# Patient Record
Sex: Male | Born: 1945 | Race: White | Hispanic: No | Marital: Married | Smoking: Former smoker
Health system: Southern US, Community
[De-identification: ages and names within clinical notes are randomized; demographics above are authoritative.]

## PROBLEM LIST (undated history)

## (undated) DIAGNOSIS — E785 Hyperlipidemia, unspecified: Secondary | ICD-10-CM

## (undated) DIAGNOSIS — I10 Essential (primary) hypertension: Secondary | ICD-10-CM

## (undated) HISTORY — DX: Hyperlipidemia, unspecified: E78.5

## (undated) HISTORY — DX: Essential (primary) hypertension: I10

---

## 2003-03-09 ENCOUNTER — Emergency Department (HOSPITAL_COMMUNITY): Admission: EM | Admit: 2003-03-09 | Discharge: 2003-03-09 | Payer: Self-pay | Admitting: Emergency Medicine

## 2005-01-22 ENCOUNTER — Ambulatory Visit: Payer: Self-pay | Admitting: Internal Medicine

## 2005-07-23 ENCOUNTER — Ambulatory Visit: Payer: Self-pay | Admitting: Internal Medicine

## 2005-12-10 ENCOUNTER — Ambulatory Visit: Payer: Self-pay | Admitting: Internal Medicine

## 2006-02-04 ENCOUNTER — Emergency Department (HOSPITAL_COMMUNITY): Admission: EM | Admit: 2006-02-04 | Discharge: 2006-02-04 | Payer: Self-pay | Admitting: Emergency Medicine

## 2006-06-10 ENCOUNTER — Ambulatory Visit: Payer: Self-pay | Admitting: Internal Medicine

## 2006-10-21 ENCOUNTER — Ambulatory Visit: Payer: Self-pay | Admitting: Internal Medicine

## 2006-10-21 LAB — CONVERTED CEMR LAB
BUN: 12 mg/dL (ref 6–23)
Creatinine, Ser: 1.3 mg/dL (ref 0.4–1.5)
GFR calc non Af Amer: 60 mL/min
HDL: 33.9 mg/dL — ABNORMAL LOW (ref >39.0)
LDL DIRECT: 185 mg/dL
Potassium: 3.8 meq/L (ref 3.5–5.1)
VLDL: 30 mg/dL (ref 0–40)

## 2007-02-10 ENCOUNTER — Ambulatory Visit: Payer: Self-pay | Admitting: Internal Medicine

## 2007-02-10 ENCOUNTER — Encounter: Payer: Self-pay | Admitting: Internal Medicine

## 2007-02-10 DIAGNOSIS — I10 Essential (primary) hypertension: Secondary | ICD-10-CM | POA: Insufficient documentation

## 2007-02-10 DIAGNOSIS — E785 Hyperlipidemia, unspecified: Secondary | ICD-10-CM

## 2007-02-10 LAB — CONVERTED CEMR LAB
Basophils Absolute: 0 10*3/uL (ref 0.0–0.1)
Bilirubin Urine: NEGATIVE
Cholesterol: 163 mg/dL (ref 0–200)
Eosinophils Absolute: 0.1 10*3/uL (ref 0.0–0.6)
GFR calc non Af Amer: 73 mL/min
HCT: 43.8 % (ref 39.0–52.0)
Hemoglobin: 15.5 g/dL (ref 13.0–17.0)
Hgb A1c MFr Bld: 5.9 % (ref 4.6–6.0)
Ketones, ur: NEGATIVE mg/dL
Lymphocytes Relative: 18.2 % (ref 12.0–46.0)
MCHC: 35.4 g/dL (ref 30.0–36.0)
MCV: 85.3 fL (ref 78.0–100.0)
Monocytes Absolute: 0.5 10*3/uL (ref 0.2–0.7)
Neutro Abs: 3.7 10*3/uL (ref 1.4–7.7)
Neutrophils Relative %: 70 % (ref 43.0–77.0)
Nitrite: NEGATIVE
Potassium: 4.1 meq/L (ref 3.5–5.1)
RBC: 5.14 M/uL (ref 4.22–5.81)
Sodium: 140 meq/L (ref 135–145)
Specific Gravity, Urine: 1.013 (ref 1.005–1.03)
Total CHOL/HDL Ratio: 4.7
pH: 7.5 (ref 5.0–8.0)

## 2007-10-09 ENCOUNTER — Ambulatory Visit: Payer: Self-pay | Admitting: Internal Medicine

## 2007-10-11 LAB — CONVERTED CEMR LAB
BUN: 9 mg/dL (ref 6–23)
CO2: 27 meq/L (ref 19–32)
Cholesterol: 151 mg/dL (ref 0–200)
GFR calc Af Amer: 80 mL/min
HDL: 30.5 mg/dL — ABNORMAL LOW (ref >39.0)
Potassium: 4.1 meq/L (ref 3.5–5.1)
Sodium: 136 meq/L (ref 135–145)
Triglycerides: 225 mg/dL (ref 0–149)
VLDL: 45 mg/dL — ABNORMAL HIGH (ref 0–40)

## 2008-05-10 ENCOUNTER — Ambulatory Visit: Payer: Self-pay | Admitting: Internal Medicine

## 2008-08-30 ENCOUNTER — Encounter (INDEPENDENT_AMBULATORY_CARE_PROVIDER_SITE_OTHER): Payer: Self-pay | Admitting: *Deleted

## 2008-11-12 ENCOUNTER — Encounter (INDEPENDENT_AMBULATORY_CARE_PROVIDER_SITE_OTHER): Payer: Self-pay | Admitting: *Deleted

## 2008-12-06 ENCOUNTER — Ambulatory Visit: Payer: Self-pay | Admitting: Internal Medicine

## 2008-12-06 ENCOUNTER — Encounter (INDEPENDENT_AMBULATORY_CARE_PROVIDER_SITE_OTHER): Payer: Self-pay | Admitting: *Deleted

## 2008-12-07 LAB — CONVERTED CEMR LAB
AST: 32 units/L (ref 0–37)
BUN: 11 mg/dL (ref 6–23)
Basophils Absolute: 0 10*3/uL (ref 0.0–0.1)
Basophils Relative: 0.3 % (ref 0.0–3.0)
CO2: 29 meq/L (ref 19–32)
Calcium: 9 mg/dL (ref 8.4–10.5)
Chloride: 102 meq/L (ref 96–112)
Cholesterol: 130 mg/dL (ref 0–200)
Creatinine, Ser: 1.2 mg/dL (ref 0.4–1.5)
Eosinophils Relative: 1.5 % (ref 0.0–5.0)
Glucose, Bld: 98 mg/dL (ref 70–99)
Hemoglobin: 16 g/dL (ref 13.0–17.0)
LDL Cholesterol: 77 mg/dL (ref 0–99)
Lymphocytes Relative: 20.4 % (ref 12.0–46.0)
MCHC: 33.7 g/dL (ref 30.0–36.0)
Monocytes Relative: 10.8 % (ref 3.0–12.0)
Neutro Abs: 3 10*3/uL (ref 1.4–7.7)
Neutrophils Relative %: 67 % (ref 43.0–77.0)
RBC: 5.23 M/uL (ref 4.22–5.81)
TSH: 0.86 microintl units/mL (ref 0.35–5.50)
VLDL: 23 mg/dL (ref 0–40)
WBC: 4.5 10*3/uL (ref 4.5–10.5)

## 2008-12-24 ENCOUNTER — Ambulatory Visit: Payer: Self-pay | Admitting: Internal Medicine

## 2008-12-30 ENCOUNTER — Encounter (INDEPENDENT_AMBULATORY_CARE_PROVIDER_SITE_OTHER): Payer: Self-pay | Admitting: *Deleted

## 2008-12-30 LAB — CONVERTED CEMR LAB: Fecal Occult Bld: NEGATIVE

## 2009-01-22 ENCOUNTER — Encounter (INDEPENDENT_AMBULATORY_CARE_PROVIDER_SITE_OTHER): Payer: Self-pay | Admitting: *Deleted

## 2009-05-16 ENCOUNTER — Ambulatory Visit: Payer: Self-pay | Admitting: Internal Medicine

## 2009-09-02 ENCOUNTER — Encounter (INDEPENDENT_AMBULATORY_CARE_PROVIDER_SITE_OTHER): Payer: Self-pay | Admitting: *Deleted

## 2009-11-21 ENCOUNTER — Ambulatory Visit: Payer: Self-pay | Admitting: Internal Medicine

## 2009-11-26 LAB — CONVERTED CEMR LAB
ALT: 34 units/L (ref 0–53)
AST: 27 units/L (ref 0–37)
Basophils Absolute: 0 10*3/uL (ref 0.0–0.1)
Calcium: 8.9 mg/dL (ref 8.4–10.5)
Cholesterol: 134 mg/dL (ref 0–200)
Eosinophils Relative: 2.1 % (ref 0.0–5.0)
GFR calc non Af Amer: 65.19 mL/min (ref >60)
Glucose, Bld: 109 mg/dL — ABNORMAL HIGH (ref 70–99)
HCT: 50.2 % (ref 39.0–52.0)
HDL: 30.8 mg/dL — ABNORMAL LOW (ref >39.00)
Hemoglobin: 15.7 g/dL (ref 13.0–17.0)
LDL Cholesterol: 66 mg/dL (ref 0–99)
Lymphocytes Relative: 17.9 % (ref 12.0–46.0)
Monocytes Relative: 11.5 % (ref 3.0–12.0)
Neutro Abs: 3.7 10*3/uL (ref 1.4–7.7)
Platelets: 198 10*3/uL (ref 150.0–400.0)
Potassium: 4.1 meq/L (ref 3.5–5.1)
RDW: 12.5 % (ref 11.5–14.6)
Sodium: 140 meq/L (ref 135–145)
VLDL: 37 mg/dL (ref 0.0–40.0)
WBC: 5.3 10*3/uL (ref 4.5–10.5)

## 2009-12-05 ENCOUNTER — Encounter (INDEPENDENT_AMBULATORY_CARE_PROVIDER_SITE_OTHER): Payer: Self-pay | Admitting: *Deleted

## 2009-12-08 ENCOUNTER — Ambulatory Visit: Payer: Self-pay | Admitting: Internal Medicine

## 2010-01-14 ENCOUNTER — Ambulatory Visit: Payer: Self-pay | Admitting: Internal Medicine

## 2010-07-24 ENCOUNTER — Ambulatory Visit: Payer: Self-pay | Admitting: Internal Medicine

## 2010-07-24 DIAGNOSIS — R7309 Other abnormal glucose: Secondary | ICD-10-CM

## 2010-07-29 LAB — CONVERTED CEMR LAB: Hgb A1c MFr Bld: 6 % (ref 4.6–6.5)

## 2010-12-15 NOTE — Assessment & Plan Note (Signed)
Summary: fu/ns/kdc   Vital Signs:  Patient profile:   65 year old male Weight:      237 pounds Pulse rate:   71 / minute Pulse rhythm:   regular BP sitting:   130 / 80  (left arm) Cuff size:   large  Vitals Entered By: Army Fossa CMA (July 24, 2010 8:03 AM) CC: F/u visit- fasitng Comments flu shot   History of Present Illness: routine office visit Here for a followup on hyperglycemia  Current Medications (verified): 1)  Atenolol 50 Mg Tabs (Atenolol) .... Take One Tablet Daily 2)  Enalapril Maleate 10 Mg Tabs (Enalapril Maleate) .... Take One Tablet Daily 3)  Simvastatin 40 Mg  Tabs (Simvastatin) .Marland Kitchen.. 1 By Mouth Qd 4)  Adult Aspirin Low Strength 81 Mg Tbdp (Aspirin) .Marland Kitchen.. 1 A Day  Allergies: 1)  ! Pcn  Past History:  Past Medical History: Reviewed history from 03/02/2007 and no changes required. Hyperlipidemia Hypertension  Past Surgical History: Reviewed history from 12/06/2008 and no changes required. 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   Lungs:  normal respiratory effort, no intercostal retractions, no accessory muscle use, and normal breath sounds.   Heart:  normal rate, regular rhythm, no murmur, and no gallop.   Extremities:  no lower extremity edema Psych:  Oriented X3, normally interactive, not anxious appearing, and not depressed appearing.     Impression & Recommendations:  Problem # 1:  HYPERGLYCEMIA (ICD-790.29) I did review his labs with him, information provided about diet and exercise labs Orders: Venipuncture  ([address]) TLB-A1C / Hgb A1C (Glycohemoglobin) (83036-A1C) Specimen Handling (60454)  Labs Reviewed: Creat: 1.2 (11/21/2009)     Problem # 2:  HYPERLIPIDEMIA (ICD-272.4) no change His updated medication list for this problem includes:    Simvastatin 40 Mg Tabs (Simvastatin) .Marland Kitchen... 1 by mouth qd  Labs Reviewed: SGOT: 27 (11/21/2009)   SGPT: 34 (11/21/2009)   HDL:30.80 (11/21/2009), 30.7 (12/06/2008)  LDL:66 (11/21/2009), 77 (12/06/2008)  Chol:134 (11/21/2009), 130 (12/06/2008)  Trig:185.0 (11/21/2009), 113 (12/06/2008)  Problem # 3:  HYPERTENSION (ICD-401.9) well controlled His updated medication list for this problem includes:    Atenolol 50 Mg Tabs (Atenolol) .Marland Kitchen... Take one tablet daily    Enalapril Maleate 10 Mg Tabs (Enalapril maleate) .Marland Kitchen... Take one tablet daily  BP today: 130/80 Prior BP: 140/86 (11/21/2009)  Labs Reviewed: K+: 4.1 (11/21/2009) Creat: : 1.2 (11/21/2009)   Chol: 134 (11/21/2009)   HDL: 30.80 (11/21/2009)   LDL: 66 (11/21/2009)   TG: 185.0 (11/21/2009)  Complete Medication List: 1)  Atenolol 50 Mg Tabs (Atenolol) .... Take one tablet daily 2)  Enalapril Maleate 10 Mg Tabs (Enalapril maleate) .... Take one tablet daily 3)  Simvastatin 40 Mg Tabs (Simvastatin) .Marland Kitchen.. 1 by mouth qd 4)  Adult Aspirin Low Strength 81 Mg Tbdp (Aspirin) .Marland Kitchen.. 1 a day  Other Orders: Admin 1st Vaccine (09811) Flu Vaccine 47yrs + (91478)  Patient Instructions: 1)  Please schedule a follow-up appointment in 4 to 5  months, fasting , physical exam  Flu Vaccine Consent  Questions     Do you have a history of severe allergic reactions to this vaccine? no    Any prior history of allergic reactions to egg and/or gelatin? no    Do you have a sensitivity to the preservative Thimersol? no    Do you have a past history of Guillan-Barre Syndrome? no    Do you currently have an acute febrile illness? no    Have you ever had a severe reaction to latex? no    Vaccine information given and explained  to patient? yes    Are you currently pregnant? no    Lot Number:[id number]   Exp Date:05/15/2011   Site Given  Left Deltoid IMlu

## 2010-12-15 NOTE — Letter (Signed)
Summary: [facility]: Immunoassay Fecal Occult Blood (iFOB) Order Form  [facility] [place]  [address] [place], [place] [address]   Phone: [phone]  Fax: [phone]      [facility]: Immunoassay Fecal Occult Blood (iFOB) Order Form   December 05, 2009 MRN: [id number]   Kyle Hatfield 02-28-1946   Physicican Name:________paz_________________  Diagnosis Code:_______v76.51___________________      [name]

## 2010-12-15 NOTE — Assessment & Plan Note (Signed)
Summary: cpx & lab/cbs   Vital Signs:  Patient profile:   65 year old male Height:      69 inches Weight:      233.4 pounds BMI:     34.59 Pulse rate:   68 / minute Pulse rhythm:   regular BP sitting:   140 / 86  (left arm) Cuff size:   large  Vitals Entered By: [name] (November 21, 2009 8:03 AM) CC: cpx - fasting Is Patient Diabetic? No Comments  - never had colonoscopy [name]  November 21, 2009 8:06 AM    History of Present Illness: CPX feels well   Preventive Screening-Counseling & Management  Alcohol-Tobacco     Alcohol drinks/day: occasionally     Alcohol type: wine     Smoking Status: quit  Caffeine-Diet-Exercise     Caffeine use/day: 4+     Does Patient Exercise: yes     Type of exercise: walk  Current Medications (verified): 1)  Atenolol 50 Mg Tabs (Atenolol) .... Take One Tablet Daily 2)  Enalapril Maleate 10 Mg Tabs (Enalapril Maleate) .... Take One Tablet Daily 3)  Simvastatin 40 Mg  Tabs (Simvastatin) .Marland Kitchen.. 1 By Mouth Qd 4)  Adult Aspirin Low Strength 81 Mg Tbdp (Aspirin) .Marland Kitchen.. 1 A Day  Allergies (verified): 1)  ! Pcn  Past History:  Past Medical History: Reviewed history from 03/02/2007 and no changes required. Hyperlipidemia Hypertension  Past Surgical History: Reviewed history from 12/06/2008 and no changes required. no  surgeries   Family History: Reviewed history from 12/06/2008 and no changes required. 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 CV:  Denies chest pain or  discomfort and swelling of feet. Resp:  Denies cough and shortness of breath. GI:  Denies bloody stools, diarrhea, nausea, and vomiting. GU:  Denies hematuria, urinary frequency, and urinary hesitancy. Psych:  Denies anxiety and depression.  Physical Exam  General:  alert, well-developed, and overweight-appearing.   Neck:  no masses, no thyromegaly, and normal carotid upstroke.   Lungs:  normal respiratory effort, no intercostal retractions, no accessory muscle use, and normal breath sounds.   Heart:  normal rate, regular rhythm, no murmur, and no gallop.   Abdomen:  soft, non-tender, no hepatomegaly, and no splenomegaly.   Rectal:  No external abnormalities noted. Normal sphincter tone. No rectal masses or tenderness. HEM NEG Prostate:  Prostate gland firm and smooth, no enlargement, nodularity, tenderness, mass, asymmetry or induration. Extremities:  no pretibial edema bilaterally  Psych:  Cognition and judgment appear intact. Alert and cooperative with normal attention span and concentration. not anxious appearing and not depressed appearing.     Impression & Recommendations:  Problem # 1:  PREVENTIVE HEALTH CARE (ICD-V70.0) Td 2002 haf flu shot never had a Colonoscopy  , iFOB neg 11-2008 Colonoscopy Vs.iFOB cards reviewed w/ pt. Provided  iFOB but he will  call if he decides to have a  colonoscopy  diet-exercise discussed, rec carbs moderation, printed material provided regards diet  labs  printed material provided regards  shingles shot   Orders: Venipuncture ([address]08) TLB-ALT (SGPT) (84460-ALT) TLB-AST (SGOT) (84450-SGOT) TLB-BMP (Basic Metabolic Panel-BMET) (80048-METABOL) TLB-CBC Platelet - w/Differential (85025-CBCD) TLB-Lipid Panel (80061-LIPID) TLB-PSA (Prostate Specific Antigen) (84153-PSA) TLB-TSH (Thyroid Stimulating Hormone) (84443-TSH)  Complete Medication List: 1)  Atenolol 50 Mg Tabs (Atenolol) .... Take one tablet daily 2)  Enalapril Maleate 10 Mg Tabs  (Enalapril maleate) .... Take one tablet daily 3)  Simvastatin 40 Mg Tabs (Simvastatin) .Marland Kitchen.. 1 by mouth qd 4)  Adult Aspirin Low Strength 81 Mg Tbdp (Aspirin) .Marland Kitchen.. 1 a day  Patient Instructions: 1)  Please schedule a follow-up appointment in 6 months .  Prescriptions: SIMVASTATIN 40 MG  TABS (SIMVASTATIN) 1 by mouth qd  #30 x 11   Entered by:   [name]   Authorized by:   Nolon Rod. Alverta Caccamo MD   Signed by:   [name] on 11/21/2009   Method used:   Electronically to        [id number] (retail)       [address]       [place], [place]  [address]       Ph: [phone] or [phone]       Fax: [phone]   RxID:   [id number] ENALAPRIL MALEATE 10 MG TABS (ENALAPRIL MALEATE) TAKE ONE TABLET DAILY  #30 Tablet x 11   Entered by:   [name]   Authorized by:   Nolon Rod. Trinh Sanjose MD   Signed by:   [name] on 11/21/2009   Method used:   Electronically to        [id number] (retail)       [address]       [place], [place]  [address]       Ph: [phone] or [phone]       Fax: [phone]   RxID:   [id number] ATENOLOL 50 MG TABS (ATENOLOL) TAKE ONE TABLET DAILY  #30 Tablet x 11   Entered by:   [name]   Authorized by:   Nolon Rod. Kristion Holifield MD   Signed by:   [name] on 11/21/2009   Method used:   Electronically to        [id number] (retail)       [address]       [place], [place]  [address]       Ph: [phone] or [phone]       Fax: [phone]   RxID:   [id number]    Preventive Care Screening  Prior Values:    PSA:  2.01 (12/06/2008)    Last Tetanus Booster:  Td (11/15/2000)

## 2011-01-22 ENCOUNTER — Encounter: Payer: Self-pay | Admitting: Internal Medicine

## 2011-01-22 ENCOUNTER — Encounter (INDEPENDENT_AMBULATORY_CARE_PROVIDER_SITE_OTHER): Payer: BC Managed Care – PPO | Admitting: Internal Medicine

## 2011-01-22 ENCOUNTER — Other Ambulatory Visit: Payer: Self-pay | Admitting: Internal Medicine

## 2011-01-22 DIAGNOSIS — E119 Type 2 diabetes mellitus without complications: Secondary | ICD-10-CM

## 2011-01-22 DIAGNOSIS — Z0000 Encounter for general adult medical examination without abnormal findings: Secondary | ICD-10-CM

## 2011-01-22 DIAGNOSIS — R739 Hyperglycemia, unspecified: Secondary | ICD-10-CM | POA: Insufficient documentation

## 2011-01-22 DIAGNOSIS — Z23 Encounter for immunization: Secondary | ICD-10-CM

## 2011-01-22 DIAGNOSIS — E785 Hyperlipidemia, unspecified: Secondary | ICD-10-CM

## 2011-01-22 LAB — CBC WITH DIFFERENTIAL/PLATELET
Basophils Relative: 0.4 % (ref 0.0–3.0)
Eosinophils Relative: 1.8 % (ref 0.0–5.0)
Lymphocytes Relative: 18.4 % (ref 12.0–46.0)
Monocytes Relative: 9.6 % (ref 3.0–12.0)
Neutrophils Relative %: 69.8 % (ref 43.0–77.0)
RBC: 5.18 Mil/uL (ref 4.22–5.81)
WBC: 6.6 10*3/uL (ref 4.5–10.5)

## 2011-01-22 LAB — LIPID PANEL
HDL: 28.2 mg/dL — ABNORMAL LOW (ref >39.00)
Total CHOL/HDL Ratio: 5
Triglycerides: 212 mg/dL — ABNORMAL HIGH (ref 0.0–149.0)

## 2011-01-22 LAB — BASIC METABOLIC PANEL
Calcium: 9 mg/dL (ref 8.4–10.5)
Chloride: 102 mEq/L (ref 96–112)
Creatinine, Ser: 1.3 mg/dL (ref 0.4–1.5)

## 2011-01-22 LAB — ALT: ALT: 38 U/L (ref 0–53)

## 2011-02-02 NOTE — Assessment & Plan Note (Signed)
Summary: cpx&lab/cbs   Vital Signs:  Patient profile:   65 year old male Height:      69 inches Weight:      241 pounds BMI:     35.72 Pulse rate:   76 / minute Pulse rhythm:   regular BP sitting:   132 / 84  (left arm) Cuff size:   large  Vitals Entered By: Army Fossa CMA (January 22, 2011 8:31 AM) CC: CPX, fasting  Comments no concerns [facility] Td-today not had colonoscopy    History of Present Illness:  complete physical exam  Preventive Screening-Counseling & Management  Caffeine-Diet-Exercise     Times/week: 6  Allergies: 1)  ! Pcn  Past History:  Past Medical History: Hyperlipidemia Hypertension Diabetes mellitus, type II  dx w/ an  A1c 6.0 2011  Past Surgical History: Reviewed history from 12/06/2008 and no changes required. no  surgeries   Family History: Reviewed history from 12/06/2008 and no changes required. 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 Resp:  Denies cough and shortness of breath. GI:  Denies bloody stools, diarrhea, nausea, and vomiting. GU:  Denies hematuria, urinary frequency, and urinary hesitancy.  Physical Exam  General:  alert, well-developed, and mod. overweight-appearing.   Neck:  no masses, no thyromegaly, and normal carotid upstroke.   Lungs:  normal respiratory effort, no intercostal retractions, no accessory muscle use, and normal breath sounds.   Heart:  normal rate, regular rhythm, no murmur, and no gallop.   Abdomen:  soft, non-tender, no distention, no masses, no guarding, and no rigidity.     Rectal:  No external abnormalities noted. Normal sphincter tone. No rectal masses or tenderness. Prostate:  Prostate gland firm and smooth, no enlargement, nodularity, tenderness, mass, asymmetry or induration. Extremities:  no pretibial edema bilaterally  Psych:  Oriented X3, memory intact for recent and remote, normally interactive, good eye contact, not anxious appearing, and not depressed appearing.     Impression & Recommendations:  Problem # 1:  PREVENTIVE HEALTH CARE (ICD-V70.0)  Td 2002 and 3-12  shingles shot discussed    iFOB neg 11-2008, 11-2009 Colonoscopy Vs.iFOB cards reviewed w/ pt. Provided  iFOB but he will  call if he decides to have a  colonoscopy   diet-exercise discussed, rec carbs moderation     Orders: Venipuncture ([address]) TLB-ALT (SGPT) (84460-ALT) TLB-AST (SGOT) (84450-SGOT) TLB-BMP (Basic Metabolic Panel-BMET) (80048-METABOL) TLB-CBC Platelet - w/Differential (85025-CBCD) TLB-Lipid Panel (80061-LIPID) TLB-PSA (Prostate Specific Antigen) (84153-PSA) TLB-TSH (Thyroid Stimulating Hormone) (84443-TSH)  Complete Medication List: 1)  Atenolol 50 Mg Tabs (Atenolol) .... Take one tablet daily 2)  Enalapril Maleate 10 Mg Tabs (Enalapril maleate) .... Take one tablet daily 3)  Simvastatin 40 Mg Tabs (Simvastatin) .Marland Kitchen.. 1 by mouth qd 4)  Adult Aspirin Low Strength 81 Mg Tbdp (Aspirin) .Marland Kitchen.. 1 a day  Other Orders: Tdap => 75yrs IM ([address]) Admin 1st Vaccine (09811) TLB-A1C / Hgb A1C (Glycohemoglobin) (83036-A1C) Specimen Handling (91478)  Patient Instructions: 1)  Please schedule a follow-up appointment in 6 months .    Orders Added: 1)  Tdap => 23yrs IM [90715] 2)  Admin 1st Vaccine [90471] 3)  Venipuncture [36415] 4)  TLB-BMP (Basic Metabolic Panel-BMET) [80048-METABOL] 5)  TLB-CBC Platelet - w/Differential [85025-CBCD] 6)  TLB-Lipid Panel [80061-LIPID] 7)  TLB-ALT (SGPT) [84460-ALT] 8)  TLB-AST (SGOT) [84450-SGOT] 9)  TLB-A1C / Hgb A1C  (Glycohemoglobin) [83036-A1C] 10)  TLB-PSA (Prostate Specific Antigen) [56213-YQM] 11)  Specimen Handling [99000] 12)  Est. Patient age 31-64 [83]   Immunizations Administered:  Tetanus Vaccine:    Vaccine Type: Tdap    Site: right deltoid    Mfr: GlaxoSmithKline    Dose: 0.5 ml    Route: IM    Given by: Army Fossa CMA    Exp. Date: 09/03/2012    Lot #: [id number]   Immunizations Administered:  Tetanus Vaccine:    Vaccine Type: Tdap    Site: right deltoid    Mfr: GlaxoSmithKline    Dose: 0.5 ml    Route: IM    Given by: Army Fossa CMA    Exp. Date: 09/03/2012    Lot #: [id number]   Risk Factors:  Exercise:  yes    Times per week:  6

## 2011-02-04 ENCOUNTER — Encounter: Payer: Self-pay | Admitting: *Deleted

## 2011-02-10 ENCOUNTER — Other Ambulatory Visit (INDEPENDENT_AMBULATORY_CARE_PROVIDER_SITE_OTHER): Payer: BC Managed Care – PPO | Admitting: Internal Medicine

## 2011-02-10 ENCOUNTER — Other Ambulatory Visit: Payer: BC Managed Care – PPO

## 2011-02-10 DIAGNOSIS — Z1211 Encounter for screening for malignant neoplasm of colon: Secondary | ICD-10-CM

## 2011-02-11 NOTE — Letter (Signed)
Summary: [facility]: Immunoassay Fecal Occult Blood (iFOB) Order Form  [facility] [place]  [address] [place], [place] [address]   Phone: [phone]  Fax: [phone]      [facility]: Immunoassay Fecal Occult Blood (iFOB) Order Form   February 04, 2011 MRN: [id number]   Kyle Hatfield June 22, 1946   Physicican Name:_____jose paz,md ____________________  Diagnosis Code:__________v76.51________________      Army Fossa CMA

## 2011-02-12 ENCOUNTER — Telehealth: Payer: Self-pay | Admitting: *Deleted

## 2011-02-12 NOTE — Telephone Encounter (Signed)
Message copied by Army Fossa on Fri Feb 12, 2011  1:44 PM ------      Message from: Willow Ora      Created: Fri Feb 12, 2011 12:59 PM       Notify patient, test is negative

## 2011-02-12 NOTE — Telephone Encounter (Signed)
Message left for patient to return my call.  

## 2011-02-15 NOTE — Telephone Encounter (Signed)
Pt is aware.  

## 2011-04-08 ENCOUNTER — Telehealth: Payer: Self-pay | Admitting: *Deleted

## 2011-04-08 MED ORDER — FENOFIBRATE 160 MG PO TABS: 160.0000 mg | ORAL_TABLET | Freq: Every day | ORAL | Status: DC

## 2011-04-08 NOTE — Telephone Encounter (Signed)
Fenofibrate 160mg  1 po qd generic , f/u as planned

## 2011-04-08 NOTE — Telephone Encounter (Signed)
Pt is aware.  

## 2011-04-08 NOTE — Telephone Encounter (Signed)
Pt called and he states that he cannot afford brand name Tricor 145mg . He would like a generic please advise.   Pharm- [facility] [address]

## 2011-06-20 ENCOUNTER — Other Ambulatory Visit: Payer: Self-pay | Admitting: Internal Medicine

## 2011-07-23 ENCOUNTER — Other Ambulatory Visit: Payer: Self-pay | Admitting: Internal Medicine

## 2011-07-23 NOTE — Telephone Encounter (Signed)
Rx Done . 

## 2011-07-29 ENCOUNTER — Encounter: Payer: Self-pay | Admitting: Internal Medicine

## 2011-07-30 ENCOUNTER — Encounter: Payer: Self-pay | Admitting: Internal Medicine

## 2011-07-30 ENCOUNTER — Ambulatory Visit (INDEPENDENT_AMBULATORY_CARE_PROVIDER_SITE_OTHER): Payer: BC Managed Care – PPO | Admitting: Internal Medicine

## 2011-07-30 DIAGNOSIS — E785 Hyperlipidemia, unspecified: Secondary | ICD-10-CM

## 2011-07-30 DIAGNOSIS — E119 Type 2 diabetes mellitus without complications: Secondary | ICD-10-CM

## 2011-07-30 DIAGNOSIS — I10 Essential (primary) hypertension: Secondary | ICD-10-CM

## 2011-07-30 LAB — BASIC METABOLIC PANEL
CO2: 29 mEq/L (ref 19–32)
Glucose, Bld: 110 mg/dL — ABNORMAL HIGH (ref 70–99)
Potassium: 4.7 mEq/L (ref 3.5–5.1)
Sodium: 138 mEq/L (ref 135–145)

## 2011-07-30 LAB — HEMOGLOBIN A1C: Hgb A1c MFr Bld: 5.7 % (ref 4.6–6.5)

## 2011-07-30 LAB — LIPID PANEL
HDL: 39 mg/dL — ABNORMAL LOW (ref >39.00)
Total CHOL/HDL Ratio: 4
VLDL: 20.6 mg/dL (ref 0.0–40.0)

## 2011-07-30 NOTE — Assessment & Plan Note (Addendum)
Doing great! Wt decreased from 241---> 226 Praised  labs

## 2011-07-30 NOTE — Assessment & Plan Note (Signed)
Adde fenofibrate base on last FLP, doing well, labs

## 2011-07-30 NOTE — Progress Notes (Signed)
  Subjective:    Patient ID: Kyle Hatfield, male    DOB: [birth year], 65 y.o.   MRN: [id number]  HPI Routine office visit Diabetes--has changed his diet, eating less ice cream, has lost weight! Hyperlipidemia--we added fenofibrate, good tolerance and compliance Hypertension--good medication compliance, ambulatory BPs within normal when checked.   Past Medical History  Diagnosis Date  . Hyperlipidemia   . Hypertension   . Diabetes mellitus      dx w/ an  A1c 6.0 2011   Past Surgical History  Procedure Date  . No past surgeries        Review of Systems Denies any chest pain or shortness of breath No nausea, vomiting, diarrhea No cough or difficulty breathing    Objective:   Physical Exam  Constitutional: He is oriented to person, place, and time. He appears well-developed and well-nourished.  HENT:  Head: Normocephalic and atraumatic.  Cardiovascular: Normal rate, regular rhythm and normal heart sounds.  Exam reveals no gallop.   No murmur heard. Pulmonary/Chest: Effort normal and breath sounds normal. No respiratory distress. He has no wheezes. He has no rales.  Musculoskeletal:       DIABETIC FEET EXAM: No lower extremity edema Normal  cap refill all toes  Skin and nails are normal without calluses Pinprick examination of the feet normal.    Neurological: He is alert and oriented to person, place, and time.          Assessment & Plan:

## 2011-07-30 NOTE — Assessment & Plan Note (Signed)
Well controlled, labs  

## 2011-08-02 ENCOUNTER — Encounter: Payer: Self-pay | Admitting: *Deleted

## 2011-08-02 NOTE — Progress Notes (Signed)
Quick Note:  Left a message for pt to return call. ______ 

## 2011-08-04 NOTE — Progress Notes (Signed)
Quick Note:    Labs mailed.  ______

## 2011-08-30 ENCOUNTER — Other Ambulatory Visit: Payer: Self-pay | Admitting: Internal Medicine

## 2011-08-30 NOTE — Telephone Encounter (Signed)
Done

## 2011-10-28 ENCOUNTER — Other Ambulatory Visit: Payer: Self-pay | Admitting: Internal Medicine

## 2011-10-28 MED ORDER — ATENOLOL 50 MG PO TABS: ORAL_TABLET | ORAL | Status: DC

## 2011-10-28 MED ORDER — FENOFIBRATE 160 MG PO TABS: ORAL_TABLET | ORAL | Status: DC

## 2011-10-28 MED ORDER — SIMVASTATIN 40 MG PO TABS: ORAL_TABLET | ORAL | Status: DC

## 2011-10-28 MED ORDER — ENALAPRIL MALEATE 10 MG PO TABS: ORAL_TABLET | ORAL | Status: DC

## 2011-10-28 NOTE — Telephone Encounter (Signed)
Rx sent 

## 2012-02-28 ENCOUNTER — Ambulatory Visit (INDEPENDENT_AMBULATORY_CARE_PROVIDER_SITE_OTHER): Payer: BC Managed Care – PPO | Admitting: Internal Medicine

## 2012-02-28 ENCOUNTER — Encounter: Payer: Self-pay | Admitting: Internal Medicine

## 2012-02-28 VITALS — BP 138/88 | HR 67 | Temp 98.3°F | Wt 234.0 lb

## 2012-02-28 DIAGNOSIS — E785 Hyperlipidemia, unspecified: Secondary | ICD-10-CM

## 2012-02-28 DIAGNOSIS — I10 Essential (primary) hypertension: Secondary | ICD-10-CM

## 2012-02-28 LAB — ALT: ALT: 39 U/L (ref 0–53)

## 2012-02-28 LAB — AST: AST: 30 U/L (ref 0–37)

## 2012-02-28 LAB — BASIC METABOLIC PANEL WITH GFR
BUN: 11 mg/dL (ref 6–23)
CO2: 26 meq/L (ref 19–32)
Calcium: 9 mg/dL (ref 8.4–10.5)
Chloride: 102 meq/L (ref 96–112)
Creatinine, Ser: 1.3 mg/dL (ref 0.4–1.5)
GFR: 59.01 mL/min — ABNORMAL LOW (ref >60.00)
Glucose, Bld: 109 mg/dL — ABNORMAL HIGH (ref 70–99)
Potassium: 4.5 meq/L (ref 3.5–5.1)
Sodium: 137 meq/L (ref 135–145)

## 2012-02-28 MED ORDER — ATENOLOL 50 MG PO TABS: ORAL_TABLET | ORAL | Status: DC

## 2012-02-28 MED ORDER — FENOFIBRATE 160 MG PO TABS: ORAL_TABLET | ORAL | Status: DC

## 2012-02-28 MED ORDER — ENALAPRIL MALEATE 10 MG PO TABS: ORAL_TABLET | ORAL | Status: DC

## 2012-02-28 MED ORDER — SIMVASTATIN 40 MG PO TABS: ORAL_TABLET | ORAL | Status: DC

## 2012-02-28 NOTE — Progress Notes (Signed)
  Subjective:    Patient ID: Kyle Hatfield, male    DOB: [birth year], 66 y.o.   MRN: [id number]  HPI Routine office visit, and does not desire a complete physical today. Good medication compliance with blood pressure medications, not ambulatory BPs Good medication compliance with both cholesterol medicines, no apparent side effects.   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 Diet is satisfactory Was not very active during the winter but he is ready to go back to a more active lifestyle.     Objective:   Physical Exam General -- alert, well-developed, and well-nourished. NAD  Neck --no LADs Lungs -- normal respiratory effort, no intercostal retractions, no accessory muscle use, and normal breath sounds.   Heart-- normal rate, regular rhythm, no murmur, and no gallop.   Extremities-- no pretibial edema bilaterally

## 2012-02-28 NOTE — Assessment & Plan Note (Addendum)
DBP today is 88, BP is usually okay. Recheck on return to the office  check a BMP

## 2012-02-28 NOTE — Assessment & Plan Note (Signed)
Well controlled, check LFTs 

## 2012-03-02 ENCOUNTER — Encounter: Payer: Self-pay | Admitting: *Deleted

## 2012-09-06 ENCOUNTER — Telehealth: Payer: Self-pay | Admitting: Internal Medicine

## 2012-09-06 ENCOUNTER — Encounter: Payer: Self-pay | Admitting: Internal Medicine

## 2012-09-06 ENCOUNTER — Ambulatory Visit (INDEPENDENT_AMBULATORY_CARE_PROVIDER_SITE_OTHER): Payer: BC Managed Care – PPO | Admitting: Internal Medicine

## 2012-09-06 VITALS — BP 148/86 | HR 71 | Temp 98.2°F | Wt 210.0 lb

## 2012-09-06 DIAGNOSIS — R209 Unspecified disturbances of skin sensation: Secondary | ICD-10-CM

## 2012-09-06 DIAGNOSIS — Z23 Encounter for immunization: Secondary | ICD-10-CM

## 2012-09-06 DIAGNOSIS — E785 Hyperlipidemia, unspecified: Secondary | ICD-10-CM

## 2012-09-06 DIAGNOSIS — R202 Paresthesia of skin: Secondary | ICD-10-CM

## 2012-09-06 DIAGNOSIS — E119 Type 2 diabetes mellitus without complications: Secondary | ICD-10-CM

## 2012-09-06 DIAGNOSIS — I10 Essential (primary) hypertension: Secondary | ICD-10-CM

## 2012-09-06 LAB — LIPID PANEL
Cholesterol: 199 mg/dL (ref 0–200)
LDL Cholesterol: 145 mg/dL — ABNORMAL HIGH (ref 0–99)
Total CHOL/HDL Ratio: 6
VLDL: 20 mg/dL (ref 0.0–40.0)

## 2012-09-06 LAB — CBC WITH DIFFERENTIAL/PLATELET
Basophils Relative: 0.4 % (ref 0.0–3.0)
Eosinophils Absolute: 0.1 10*3/uL (ref 0.0–0.7)
Lymphocytes Relative: 18.4 % (ref 12.0–46.0)
MCHC: 32.3 g/dL (ref 30.0–36.0)
Monocytes Relative: 10.7 % (ref 3.0–12.0)
Neutrophils Relative %: 68.9 % (ref 43.0–77.0)
RBC: 5.23 Mil/uL (ref 4.22–5.81)
WBC: 4.9 10*3/uL (ref 4.5–10.5)

## 2012-09-06 MED ORDER — ENALAPRIL MALEATE 20 MG PO TABS: 20.0000 mg | ORAL_TABLET | Freq: Every day | ORAL | Status: DC

## 2012-09-06 NOTE — Assessment & Plan Note (Signed)
Good compliance with medication, check FLP, AST, ALT

## 2012-09-06 NOTE — Telephone Encounter (Signed)
No referral has been entered °

## 2012-09-06 NOTE — Telephone Encounter (Signed)
Patient's wife states the pt needs referral to neurologist ASAP. She states she is worried he is going to fall again and would appreciate a quick turnaround for this referral. I advised pt I would let our patient care coordinator know.

## 2012-09-06 NOTE — Assessment & Plan Note (Addendum)
66 year old gentleman presents with neck pain, upper extremity paresthesias, occasional difficulty with what seems to be a right footdrop. Etiology not completely clear, will refer him to neurology for a more comprehensive neurological exam Will check a vitamin B12, TSH and folic acid.

## 2012-09-06 NOTE — Progress Notes (Signed)
  Subjective:    Patient ID: Kyle Hatfield, male    DOB: 24-Sep-1946, 66 y.o.   MRN: [id number]  HPI Acute visit One month history of occasional neck pain with certain neck movements, sometimes gets sharp, short lived pain at the upper back with neck movement (" like an electrical shock "). Also noted paresthesias in the upper extremities described as fingers feel numb, cold. Some ill-defined discomfort in the forearms as well. As the day goes by sometimes develops  what seems to be a right footdrop.  We also talked about his chronic issues: Cholesterol good medication compliance Hypertension good medication compliance, not ambulatory BPs. BP today 140/86. 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 No bladder or bowel incontinence. Number of the lower extremities or saddle anesthesia. Denies any difficulty swallowing or diplopia.    Objective:   Physical Exam General -- alert, well-developed, and well-nourished.   Lungs -- normal respiratory effort, no intercostal retractions, no accessory muscle use, and normal breath sounds.   Heart-- normal rate, regular rhythm, no murmur, and no gallop.   Extremities-- no pretibial edema bilaterally Neurologic-- alert & oriented X3, face symmetric, EOMI, pupils equal and reactive, DTRs and strength normal in all extremities. Pinprick examination of the extremities  normal. Gait normal this morning Psych-- Cognition and judgment appear intact. Alert and cooperative with normal attention span and concentration.  not anxious appearing and not depressed appearing.       Assessment & Plan:

## 2012-09-06 NOTE — Telephone Encounter (Signed)
Referral entered  

## 2012-09-06 NOTE — Assessment & Plan Note (Signed)
Mild hyperglycemia, check the A1c.

## 2012-09-06 NOTE — Assessment & Plan Note (Addendum)
Good compliance with medicines, no amb BP readings, BP today slightly elevated. Well-controlled?. Plan: Increase Vasotec to 20 mg check ambulatory BPs

## 2012-09-06 NOTE — Patient Instructions (Signed)
Check the  blood pressure 2 or 3 times a week, be sure it is between 110/60 and 140/85. If it is consistently higher or lower, let me know  

## 2012-09-07 LAB — TSH: TSH: 0.83 u[IU]/mL (ref 0.35–5.50)

## 2012-09-11 ENCOUNTER — Encounter: Payer: Self-pay | Admitting: *Deleted

## 2012-09-12 ENCOUNTER — Ambulatory Visit
Admission: RE | Admit: 2012-09-12 | Discharge: 2012-09-12 | Disposition: A | Discharge status: Home / Self Care | Payer: BC Managed Care – PPO | Source: Ambulatory Visit | Attending: Neurology | Admitting: Neurology

## 2012-09-12 ENCOUNTER — Other Ambulatory Visit: Payer: Self-pay | Admitting: Neurology

## 2012-09-12 DIAGNOSIS — M5410 Radiculopathy, site unspecified: Secondary | ICD-10-CM

## 2012-09-12 DIAGNOSIS — M542 Cervicalgia: Secondary | ICD-10-CM

## 2012-09-12 DIAGNOSIS — Z139 Encounter for screening, unspecified: Secondary | ICD-10-CM

## 2012-09-18 ENCOUNTER — Encounter (HOSPITAL_COMMUNITY): Payer: Self-pay

## 2012-09-18 ENCOUNTER — Other Ambulatory Visit: Payer: Self-pay | Admitting: Neurosurgery

## 2012-09-18 NOTE — H&P (Signed)
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 Terrace Arabia, having obtained a cervical MRI which showed increased cord signal at the C4-5 level.  He complains of neck pain and says this has been going on since October 1st, 2013, and also notes numbness and tingling in his arms, legs, feet, and hands.  He says that he has had decreased severity over the last few days, but generally it has been getting worse.  He feels that he has had numbness for approximately five weeks.  He notes when he moves his neck or turns it a certain way he will get a shock-like feeling in his arms and legs.    REVIEW OF SYSTEMS:   A detailed Review of Systems sheet was reviewed with the patient.  Pertinent positives include under cardiovascular - he notes high blood pressure and high cholesterol, under musculoskeletal - he notes neck pain; otherwise unremarkable.  All other systems are negative; this includes Constitutional symptoms, Eyes, Ears, nose, mouth, throat, Endocrine, Respiratory, Gastrointestinal, Genitourinary, Integumentary & Breast, Neurologic, Psychiatric, Hematologic/Lymphatic, Allergic/Immunologic.    PAST MEDICAL HISTORY:      Current Medical Conditions:    Kyle Hatfield has a history of hypertension and elevated cholesterol.      Prior Operations and Hospitalizations:   He has had no prior surgeries.      Medications and Allergies:  Current medications include Atenolol 50 mg. daily, Fenofibrate 160 mg. daily, Simvastatin 40 mg. daily, Aspirin 81 mg. daily, and Enalapril 20 mg. daily. He is ALLERGIC TO PENICILLIN.      Height and Weight:     He is currently 5'9" tall, 210 lbs. BMI is 31.0.   FAMILY HISTORY:    His mother died at age 60.  His father died at age 69.   SOCIAL HISTORY:    He denies tobacco, alcohol, or drug use.    DIAGNOSTIC  STUDIES:   He had an MRI of his cervical spine which was obtained through [facility] on 09/12/2012, which shows focal myelopathy at C4-5 due to cervical spinal stenosis, and moderate cervical spinal stenosis at C5-6.  There is severe right foraminal stenosis at C2-3 and bilaterally at C3-4, on the left at C4-5, bilaterally at C5-6.    Plain radiographs of the cervical spine demonstrate focal degenerative changes most pronounced at the C4-5 and C5-6 levels, with anterolisthesis of C4 on C5 of several millimeters on flexion which reduces on extension.    PHYSICAL EXAMINATION:      General Appearance:   On examination today, Kyle Hatfield is a pleasant and cooperative man in no acute distress.      Blood Pressure, Pulse:     His blood pressure is 150/78.  Heart rate is 74 and regular.  Respiratory rate is 16.      HEENT - normocephalic, atraumatic.  The pupils are equal, round and reactive to light.  The extraocular muscles are intact.  Sclerae - white.  Conjunctiva - pink.  Oropharynx benign.  Uvula midline.     Neck - there are no masses, meningismus, deformities, tracheal deviation, jugular vein distention or carotid bruits.  There is normal cervical range of motion.  He does not appear to have a markedly positive Spurlings' maneuver to either side.  He has a positive Lhermitte's sign  with axial compression and with extension of his neck.      Respiratory - there is normal respiratory effort with good intercostal function.  Lungs are clear to auscultation.  There are no rales, rhonchi or wheezes.      Cardiovascular - the heart has regular rate and rhythm to auscultation.  No murmurs are appreciated.  There is no extremity edema, cyanosis or clubbing.  There are palpable pedal pulses.      Abdomen - soft, nontender, no hepatosplenomegaly appreciated or masses.  There are active bowel sounds.  No guarding or rebound.      Musculoskeletal Examination - the patient is able to walk about the  examining room with a normal, casual, heel and toe gait.    NEUROLOGICAL EXAMINATION: The patient is oriented to time, person and place and has good recall of both recent and remote memory with normal attention span and concentration.    The patient speaks with clear and fluent speech and exhibits normal language function and appropriate fund of knowledge.      Cranial Nerve Examination - pupils are equal, round and reactive to light.  Extraocular movements are full.  Visual fields are full to confrontational testing.  Facial sensation and facial movement are symmetric and intact.  Hearing is intact to finger rub.  Palate is upgoing.  Shoulder shrug is symmetric.  Tongue protrudes in the midline.      Motor Examination - motor strength is 5/5 in the bilateral deltoids, triceps, handgrips, wrist extensors, interosseous, right biceps at 4/5 and left biceps at 4+/5, right triceps at 4/5 and left triceps at 4+/5, right hand intrinsic and finger extensors at 4/5, left hand intrinsics and finger extensors at 4+/5.  In the lower extremities motor strength is 5/5 in hip flexion, extension, quadriceps, hamstrings, plantar flexion, dorsiflexion and extensor hallucis longus.      Sensory Examination - he has a cervicothoracic sensory level with numbness in the arms and legs compared to upper neck and face.      Deep Tendon Reflexes - reflexes are brisk throughout, 3 in the biceps and triceps, with positive Hoffmann's signs, more marked on the right than the left, 3+ in the knees, with positive suprapatellar and crossed adductor reflexes, 3+ in the ankles with nonsustained clonus.  The great toe is clearly upgoing on the right and equivocal to downgoing on the left.  No pathologic reflexes.       Cerebellar Examination - normal coordination in upper and lower extremities and normal rapid alternating movements.  Romberg test is negative.    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  I have recommended that he undergo anterior cervical decompression and fusion C4-5 and C5-6 levels. We went over risks and benefits. I have told him given the advanced state of his myelopathy as well as the significant cord compression that I recommended strongly that he proceed with surgery on an expedited basis. He wishes to do so. We will plan on doing surgery on 09/19/2012.   I have recommended to the patient that he undergo anterior cervical discectomy and fusion with plating.  I went over the diagnostic studies in detail and reviewed surgical models and also discussed the exact nature of the surgical procedure, attendant risks, potential benefits and typical operative and postoperative course.  I discussed the risks of surgery which include, but are not limited to,  risks of anesthesia, blood loss, infection, injury to various neck structures including the trachea, esophagus, which could cause temporary or permanent swallowing difficulties and also the potential for perforation of the esophagus which might require operative intervention, larynx, recurrent laryngeal nerve, which could cause either temporary or permanent vocal cord paralysis resulting in either temporary or permanent voice changes, injury to cervical nerve roots, which could cause either temporary or permanent arm pain, numbness and/or weakness.  There is a small chance of injury to the spinal cord which could cause paralysis.  There is also the potential for malplacement of instrumentation, fusion failure, need for repeat surgery, degenerative disease at other levels in the neck, failure to relieve the pain, worsening of pain.  I also discussed with the patient that he will lose some neck mobility from the surgery.  It is typical to stay in the hospital overnight after this operation.  Typically he will not be  able to drive for two weeks after surgery and will come back to see me two weeks following surgery with a lateral C-spine x-ray and then for monthly visits to three months after surgery.  Generally patients are out of work for four to six weeks following surgery.  He will wear a soft collar for two weeks after surgery.    [facility]    Danae Orleans. Venetia Maxon, M.D.  JDS:aft cc: Dr. Levert Feinstein   Dr. Willow Ora

## 2012-09-19 ENCOUNTER — Encounter (HOSPITAL_COMMUNITY): Payer: Self-pay | Admitting: *Deleted

## 2012-09-19 ENCOUNTER — Ambulatory Visit (HOSPITAL_COMMUNITY): Payer: BC Managed Care – PPO

## 2012-09-19 ENCOUNTER — Observation Stay (HOSPITAL_COMMUNITY)
Admission: RE | Admit: 2012-09-19 | Discharge: 2012-09-20 | Disposition: A | Discharge status: Home / Self Care | Payer: BC Managed Care – PPO | Source: Ambulatory Visit | Attending: Neurosurgery | Admitting: Neurosurgery

## 2012-09-19 ENCOUNTER — Ambulatory Visit (HOSPITAL_COMMUNITY): Payer: BC Managed Care – PPO | Admitting: *Deleted

## 2012-09-19 ENCOUNTER — Encounter (HOSPITAL_COMMUNITY)
Admission: RE | Disposition: A | Discharge status: Home / Self Care | Payer: Self-pay | Source: Ambulatory Visit | Attending: Neurosurgery

## 2012-09-19 DIAGNOSIS — M4712 Other spondylosis with myelopathy, cervical region: Secondary | ICD-10-CM | POA: Insufficient documentation

## 2012-09-19 DIAGNOSIS — I10 Essential (primary) hypertension: Secondary | ICD-10-CM | POA: Insufficient documentation

## 2012-09-19 DIAGNOSIS — M5000 Cervical disc disorder with myelopathy, unspecified cervical region: Principal | ICD-10-CM | POA: Insufficient documentation

## 2012-09-19 HISTORY — PX: ANTERIOR CERVICAL DECOMP/DISCECTOMY FUSION: SHX1161

## 2012-09-19 LAB — CBC
HCT: 45.8 % (ref 39.0–52.0)
Hemoglobin: 15.8 g/dL (ref 13.0–17.0)
MCV: 86.4 fL (ref 78.0–100.0)
WBC: 6.4 10*3/uL (ref 4.0–10.5)

## 2012-09-19 LAB — BASIC METABOLIC PANEL
CO2: 26 mEq/L (ref 19–32)
Chloride: 98 mEq/L (ref 96–112)
GFR calc Af Amer: 84 mL/min — ABNORMAL LOW (ref >90)
Potassium: 4.2 mEq/L (ref 3.5–5.1)

## 2012-09-19 LAB — SURGICAL PCR SCREEN: MRSA, PCR: NEGATIVE

## 2012-09-19 SURGERY — ANTERIOR CERVICAL DECOMPRESSION/DISCECTOMY FUSION 2 LEVELS
Anesthesia: General | Site: Neck | Wound class: Clean

## 2012-09-19 MED ORDER — 0.9 % SODIUM CHLORIDE (POUR BTL) OPTIME
TOPICAL | Status: DC | PRN
  Administered 2012-09-19: 1000 mL

## 2012-09-19 MED ORDER — ROCURONIUM BROMIDE 100 MG/10ML IV SOLN
INTRAVENOUS | Status: DC | PRN
  Administered 2012-09-19: 50 mg via INTRAVENOUS

## 2012-09-19 MED ORDER — SODIUM CHLORIDE 0.9 % IJ SOLN
3.0000 mL | INTRAMUSCULAR | Status: DC | PRN
  Administered 2012-09-19: 3 mL via INTRAVENOUS

## 2012-09-19 MED ORDER — HYDROMORPHONE HCL PF 1 MG/ML IJ SOLN: 0.2500 mg | INTRAMUSCULAR | Status: DC | PRN

## 2012-09-19 MED ORDER — MUPIROCIN 2 % EX OINT
TOPICAL_OINTMENT | Freq: Two times a day (BID) | CUTANEOUS | Status: DC
  Administered 2012-09-19: 1 via NASAL
  Administered 2012-09-19: via NASAL
  Filled 2012-09-19 (×2): qty 22

## 2012-09-19 MED ORDER — DOCUSATE SODIUM 100 MG PO CAPS
100.0000 mg | ORAL_CAPSULE | Freq: Two times a day (BID) | ORAL | Status: DC
  Administered 2012-09-19: 100 mg via ORAL
  Filled 2012-09-19: qty 1

## 2012-09-19 MED ORDER — ONDANSETRON HCL 4 MG/2ML IJ SOLN
INTRAMUSCULAR | Status: DC | PRN
  Administered 2012-09-19: 4 mg via INTRAVENOUS

## 2012-09-19 MED ORDER — PANTOPRAZOLE SODIUM 40 MG IV SOLR
40.0000 mg | Freq: Every day | INTRAVENOUS | Status: DC
  Administered 2012-09-19: 40 mg via INTRAVENOUS
  Filled 2012-09-19 (×2): qty 40

## 2012-09-19 MED ORDER — ENALAPRIL MALEATE 20 MG PO TABS
20.0000 mg | ORAL_TABLET | Freq: Every day | ORAL | Status: DC
  Administered 2012-09-19: 20 mg via ORAL
  Filled 2012-09-19 (×2): qty 1

## 2012-09-19 MED ORDER — FENTANYL CITRATE 0.05 MG/ML IJ SOLN
INTRAMUSCULAR | Status: DC | PRN
  Administered 2012-09-19: 50 ug via INTRAVENOUS
  Administered 2012-09-19: 100 ug via INTRAVENOUS
  Administered 2012-09-19 (×2): 50 ug via INTRAVENOUS

## 2012-09-19 MED ORDER — GLYCOPYRROLATE 0.2 MG/ML IJ SOLN
INTRAMUSCULAR | Status: DC | PRN
  Administered 2012-09-19: 0.4 mg via INTRAVENOUS

## 2012-09-19 MED ORDER — FLEET ENEMA 7-19 GM/118ML RE ENEM
1.0000 | ENEMA | Freq: Once | RECTAL | Status: AC | PRN
  Filled 2012-09-19: qty 1

## 2012-09-19 MED ORDER — PHENOL 1.4 % MT LIQD: 1.0000 | OROMUCOSAL | Status: DC | PRN

## 2012-09-19 MED ORDER — THROMBIN 5000 UNITS EX KIT
PACK | CUTANEOUS | Status: DC | PRN
  Administered 2012-09-19 (×2): 5000 [IU] via TOPICAL

## 2012-09-19 MED ORDER — OXYCODONE-ACETAMINOPHEN 5-325 MG PO TABS: 1.0000 | ORAL_TABLET | ORAL | Status: DC | PRN

## 2012-09-19 MED ORDER — POLYETHYLENE GLYCOL 3350 17 G PO PACK
17.0000 g | PACK | Freq: Every day | ORAL | Status: DC | PRN
  Filled 2012-09-19: qty 1

## 2012-09-19 MED ORDER — SODIUM CHLORIDE 0.9 % IV SOLN
INTRAVENOUS | Status: AC
  Filled 2012-09-19: qty 500

## 2012-09-19 MED ORDER — HYDROCODONE-ACETAMINOPHEN 5-325 MG PO TABS
1.0000 | ORAL_TABLET | ORAL | Status: DC | PRN
Start: 2012-09-19 — End: 2012-09-20

## 2012-09-19 MED ORDER — BUPIVACAINE HCL (PF) 0.5 % IJ SOLN
INTRAMUSCULAR | Status: DC | PRN
  Administered 2012-09-19: 3 mL

## 2012-09-19 MED ORDER — HYDROMORPHONE HCL PF 1 MG/ML IJ SOLN
INTRAMUSCULAR | Status: DC | PRN
  Administered 2012-09-19 (×2): .5 mg via INTRAVENOUS

## 2012-09-19 MED ORDER — PHENYLEPHRINE HCL 10 MG/ML IJ SOLN
INTRAMUSCULAR | Status: DC | PRN
  Administered 2012-09-19 (×2): 80 ug via INTRAVENOUS
  Administered 2012-09-19: 40 ug via INTRAVENOUS

## 2012-09-19 MED ORDER — ONDANSETRON HCL 4 MG/2ML IJ SOLN: 4.0000 mg | INTRAMUSCULAR | Status: DC | PRN

## 2012-09-19 MED ORDER — LIDOCAINE HCL (CARDIAC) 20 MG/ML IV SOLN
INTRAVENOUS | Status: DC | PRN
  Administered 2012-09-19: 60 mg via INTRAVENOUS

## 2012-09-19 MED ORDER — ACETAMINOPHEN 10 MG/ML IV SOLN: 1000.0000 mg | Freq: Once | INTRAVENOUS | Status: DC | PRN

## 2012-09-19 MED ORDER — HEMOSTATIC AGENTS (NO CHARGE) OPTIME
TOPICAL | Status: DC | PRN
  Administered 2012-09-19: 1 via TOPICAL

## 2012-09-19 MED ORDER — ARTIFICIAL TEARS OP OINT
TOPICAL_OINTMENT | OPHTHALMIC | Status: DC | PRN
  Administered 2012-09-19: 1 via OPHTHALMIC

## 2012-09-19 MED ORDER — ASPIRIN 81 MG PO TABS: 81.0000 mg | ORAL_TABLET | Freq: Every day | ORAL | Status: DC

## 2012-09-19 MED ORDER — FENOFIBRATE 160 MG PO TABS
160.0000 mg | ORAL_TABLET | Freq: Every day | ORAL | Status: DC
  Administered 2012-09-19: 160 mg via ORAL
  Filled 2012-09-19 (×2): qty 1

## 2012-09-19 MED ORDER — ONDANSETRON HCL 4 MG/2ML IJ SOLN: 4.0000 mg | Freq: Once | INTRAMUSCULAR | Status: DC | PRN

## 2012-09-19 MED ORDER — MIDAZOLAM HCL 5 MG/5ML IJ SOLN
INTRAMUSCULAR | Status: DC | PRN
  Administered 2012-09-19: 2 mg via INTRAVENOUS

## 2012-09-19 MED ORDER — ACETAMINOPHEN 325 MG PO TABS: 650.0000 mg | ORAL_TABLET | ORAL | Status: DC | PRN

## 2012-09-19 MED ORDER — BISACODYL 10 MG RE SUPP: 10.0000 mg | Freq: Every day | RECTAL | Status: DC | PRN

## 2012-09-19 MED ORDER — MENTHOL 3 MG MT LOZG: 1.0000 | LOZENGE | OROMUCOSAL | Status: DC | PRN

## 2012-09-19 MED ORDER — KCL IN DEXTROSE-NACL 20-5-0.45 MEQ/L-%-% IV SOLN
INTRAVENOUS | Status: DC
  Administered 2012-09-19: 22:00:00 via INTRAVENOUS
  Filled 2012-09-19 (×3): qty 1000

## 2012-09-19 MED ORDER — LIDOCAINE-EPINEPHRINE 1 %-1:100000 IJ SOLN
INTRAMUSCULAR | Status: DC | PRN
  Administered 2012-09-19: 3 mL via INTRADERMAL

## 2012-09-19 MED ORDER — PROPOFOL 10 MG/ML IV BOLUS
INTRAVENOUS | Status: DC | PRN
  Administered 2012-09-19: 180 mg via INTRAVENOUS

## 2012-09-19 MED ORDER — SENNA 8.6 MG PO TABS
1.0000 | ORAL_TABLET | Freq: Two times a day (BID) | ORAL | Status: DC
  Administered 2012-09-19: 8.6 mg via ORAL
  Filled 2012-09-19 (×3): qty 1

## 2012-09-19 MED ORDER — ACETAMINOPHEN 650 MG RE SUPP: 650.0000 mg | RECTAL | Status: DC | PRN

## 2012-09-19 MED ORDER — CEFAZOLIN SODIUM-DEXTROSE 2-3 GM-% IV SOLR
INTRAVENOUS | Status: AC
  Filled 2012-09-19: qty 50

## 2012-09-19 MED ORDER — MORPHINE SULFATE 2 MG/ML IJ SOLN
1.0000 mg | INTRAMUSCULAR | Status: DC | PRN
Start: 2012-09-19 — End: 2012-09-20

## 2012-09-19 MED ORDER — SIMVASTATIN 40 MG PO TABS
40.0000 mg | ORAL_TABLET | Freq: Every evening | ORAL | Status: DC
  Administered 2012-09-19: 40 mg via ORAL
  Filled 2012-09-19 (×2): qty 1

## 2012-09-19 MED ORDER — LIDOCAINE HCL 4 % MT SOLN
OROMUCOSAL | Status: DC | PRN
  Administered 2012-09-19: 4 mL via TOPICAL

## 2012-09-19 MED ORDER — NEOSTIGMINE METHYLSULFATE 1 MG/ML IJ SOLN
INTRAMUSCULAR | Status: DC | PRN
  Administered 2012-09-19: 3 mg via INTRAVENOUS

## 2012-09-19 MED ORDER — SODIUM CHLORIDE 0.9 % IJ SOLN: 3.0000 mL | Freq: Two times a day (BID) | INTRAMUSCULAR | Status: DC

## 2012-09-19 MED ORDER — DIAZEPAM 5 MG PO TABS: 5.0000 mg | ORAL_TABLET | Freq: Four times a day (QID) | ORAL | Status: DC | PRN

## 2012-09-19 MED ORDER — SODIUM CHLORIDE 0.9 % IR SOLN
Status: DC | PRN
  Administered 2012-09-19: 16:00:00

## 2012-09-19 MED ORDER — BACITRACIN 50000 UNITS IM SOLR
INTRAMUSCULAR | Status: AC
  Filled 2012-09-19: qty 1

## 2012-09-19 MED ORDER — ASPIRIN EC 81 MG PO TBEC
81.0000 mg | DELAYED_RELEASE_TABLET | Freq: Every day | ORAL | Status: DC
  Filled 2012-09-19: qty 1

## 2012-09-19 MED ORDER — DEXAMETHASONE SODIUM PHOSPHATE 4 MG/ML IJ SOLN
INTRAMUSCULAR | Status: DC | PRN
  Administered 2012-09-19: 10 mg via INTRAVENOUS

## 2012-09-19 MED ORDER — MUPIROCIN 2 % EX OINT
TOPICAL_OINTMENT | CUTANEOUS | Status: AC
  Filled 2012-09-19: qty 22

## 2012-09-19 MED ORDER — ZOLPIDEM TARTRATE 5 MG PO TABS: 5.0000 mg | ORAL_TABLET | Freq: Every evening | ORAL | Status: DC | PRN

## 2012-09-19 MED ORDER — LACTATED RINGERS IV SOLN
INTRAVENOUS | Status: DC | PRN
  Administered 2012-09-19 (×2): via INTRAVENOUS

## 2012-09-19 MED ORDER — VANCOMYCIN HCL IN DEXTROSE 1-5 GM/200ML-% IV SOLN
1000.0000 mg | INTRAVENOUS | Status: AC
  Administered 2012-09-19: 1000 mg via INTRAVENOUS
  Filled 2012-09-19: qty 200

## 2012-09-19 MED ORDER — SODIUM CHLORIDE 0.9 % IV SOLN: 250.0000 mL | INTRAVENOUS | Status: DC

## 2012-09-19 MED ORDER — ATENOLOL 50 MG PO TABS
50.0000 mg | ORAL_TABLET | Freq: Every day | ORAL | Status: DC
  Filled 2012-09-19: qty 1

## 2012-09-19 SURGICAL SUPPLY — 72 items
ADH SKN CLS APL DERMABOND .7 (GAUZE/BANDAGES/DRESSINGS) ×1
APL SKNCLS STERI-STRIP NONHPOA (GAUZE/BANDAGES/DRESSINGS)
BAG DECANTER FOR FLEXI CONT (MISCELLANEOUS) ×2 IMPLANT
BANDAGE GAUZE ELAST BULKY 4 IN (GAUZE/BANDAGES/DRESSINGS) ×2 IMPLANT
BENZOIN TINCTURE PRP APPL 2/3 (GAUZE/BANDAGES/DRESSINGS) IMPLANT
BIT DRILL 14MM (INSTRUMENTS) IMPLANT
BIT DRILL NEURO 2X3.1 SFT TUCH (MISCELLANEOUS) ×1 IMPLANT
BLADE ULTRA TIP 2M (BLADE) ×1 IMPLANT
BUR BARREL STRAIGHT FLUTE 4.0 (BURR) ×2 IMPLANT
CANISTER SUCTION 2500CC (MISCELLANEOUS) ×2 IMPLANT
CLOTH BEACON ORANGE TIMEOUT ST (SAFETY) ×2 IMPLANT
CONT SPEC 4OZ CLIKSEAL STRL BL (MISCELLANEOUS) ×2 IMPLANT
COVER MAYO STAND STRL (DRAPES) ×2 IMPLANT
DERMABOND ADVANCED (GAUZE/BANDAGES/DRESSINGS) ×1
DERMABOND ADVANCED .7 DNX12 (GAUZE/BANDAGES/DRESSINGS) ×1 IMPLANT
DRAPE LAPAROTOMY 100X72 PEDS (DRAPES) ×2 IMPLANT
DRAPE MICROSCOPE LEICA (MISCELLANEOUS) ×1 IMPLANT
DRAPE POUCH INSTRU U-SHP 10X18 (DRAPES) ×2 IMPLANT
DRAPE PROXIMA HALF (DRAPES) ×1 IMPLANT
DRESSING TELFA 8X3 (GAUZE/BANDAGES/DRESSINGS) IMPLANT
DRILL 14MM (INSTRUMENTS) ×2
DRILL NEURO 2X3.1 SOFT TOUCH (MISCELLANEOUS) ×2
DURAPREP 6ML APPLICATOR 50/CS (WOUND CARE) ×2 IMPLANT
ELECT COATED BLADE 2.86 ST (ELECTRODE) ×2 IMPLANT
ELECT REM PT RETURN 9FT ADLT (ELECTROSURGICAL) ×2
ELECTRODE REM PT RTRN 9FT ADLT (ELECTROSURGICAL) ×1 IMPLANT
GAUZE SPONGE 4X4 16PLY XRAY LF (GAUZE/BANDAGES/DRESSINGS) IMPLANT
GLOVE BIO SURGEON STRL SZ8 (GLOVE) ×2 IMPLANT
GLOVE BIOGEL PI IND STRL 7.5 (GLOVE) IMPLANT
GLOVE BIOGEL PI IND STRL 8 (GLOVE) ×1 IMPLANT
GLOVE BIOGEL PI IND STRL 8.5 (GLOVE) ×1 IMPLANT
GLOVE BIOGEL PI INDICATOR 7.5 (GLOVE) ×1
GLOVE BIOGEL PI INDICATOR 8 (GLOVE) ×1
GLOVE BIOGEL PI INDICATOR 8.5 (GLOVE) ×1
GLOVE ECLIPSE 7.5 STRL STRAW (GLOVE) ×2 IMPLANT
GLOVE ECLIPSE 8.0 STRL XLNG CF (GLOVE) ×2 IMPLANT
GLOVE EXAM NITRILE LRG STRL (GLOVE) IMPLANT
GLOVE EXAM NITRILE MD LF STRL (GLOVE) IMPLANT
GLOVE EXAM NITRILE XL STR (GLOVE) IMPLANT
GLOVE EXAM NITRILE XS STR PU (GLOVE) IMPLANT
GOWN BRE IMP SLV AUR LG STRL (GOWN DISPOSABLE) IMPLANT
GOWN BRE IMP SLV AUR XL STRL (GOWN DISPOSABLE) ×4 IMPLANT
GOWN STRL REIN 2XL LVL4 (GOWN DISPOSABLE) ×2 IMPLANT
HEAD HALTER (SOFTGOODS) ×2 IMPLANT
KIT BASIN OR (CUSTOM PROCEDURE TRAY) ×2 IMPLANT
KIT ROOM TURNOVER OR (KITS) ×2 IMPLANT
NDL HYPO 18GX1.5 BLUNT FILL (NEEDLE) ×1 IMPLANT
NDL HYPO 25X1 1.5 SAFETY (NEEDLE) ×1 IMPLANT
NDL SPNL 22GX3.5 QUINCKE BK (NEEDLE) ×1 IMPLANT
NEEDLE HYPO 18GX1.5 BLUNT FILL (NEEDLE) IMPLANT
NEEDLE HYPO 25X1 1.5 SAFETY (NEEDLE) ×2 IMPLANT
NEEDLE SPNL 22GX3.5 QUINCKE BK (NEEDLE) ×6 IMPLANT
NS IRRIG 1000ML POUR BTL (IV SOLUTION) ×2 IMPLANT
PACK LAMINECTOMY NEURO (CUSTOM PROCEDURE TRAY) ×2 IMPLANT
PAD ARMBOARD 7.5X6 YLW CONV (MISCELLANEOUS) ×2 IMPLANT
PIN DISTRACTION 14MM (PIN) ×5 IMPLANT
PLATE 34MM (Plate) ×1 IMPLANT
RUBBERBAND STERILE (MISCELLANEOUS) ×2 IMPLANT
SCREW 14MM (Screw) ×6 IMPLANT
SPACER ASSEM CERV LORD 7M (Spacer) ×2 IMPLANT
SPONGE GAUZE 4X4 12PLY (GAUZE/BANDAGES/DRESSINGS) IMPLANT
SPONGE INTESTINAL PEANUT (DISPOSABLE) ×2 IMPLANT
SPONGE SURGIFOAM ABS GEL SZ50 (HEMOSTASIS) ×1 IMPLANT
STAPLER SKIN PROX WIDE 3.9 (STAPLE) ×1 IMPLANT
STRIP CLOSURE SKIN 1/2X4 (GAUZE/BANDAGES/DRESSINGS) IMPLANT
SUT VIC AB 3-0 SH 8-18 (SUTURE) ×2 IMPLANT
SYR 20ML ECCENTRIC (SYRINGE) ×2 IMPLANT
SYR 3ML LL SCALE MARK (SYRINGE) ×2 IMPLANT
TOWEL OR 17X24 6PK STRL BLUE (TOWEL DISPOSABLE) ×2 IMPLANT
TOWEL OR 17X26 10 PK STRL BLUE (TOWEL DISPOSABLE) ×2 IMPLANT
TRAP SPECIMEN MUCOUS 40CC (MISCELLANEOUS) ×1 IMPLANT
WATER STERILE IRR 1000ML POUR (IV SOLUTION) ×2 IMPLANT

## 2012-09-19 NOTE — Preoperative (Signed)
Beta Blockers   Reason not to administer Beta Blockers:Not Applicable 

## 2012-09-19 NOTE — Brief Op Note (Signed)
09/19/2012  5:41 PM  PATIENT:  Kyle Hatfield  66 y.o. male  PRE-OPERATIVE DIAGNOSIS:  Cervical herniated nucleus pulposus with myelopathy, Cervical spondylosis with myelopathy, Cervical stenosis, Cervical radiculopathy C 45 and C 56  POST-OPERATIVE DIAGNOSIS:  Cervical herniated nucleus pulposus with myelopathy, Cervical spondylosis with myelopathy, Cervical stenosis, Cervical radiculopathy C 45 and C 56  PROCEDURE:  Procedure(s) (LRB) with comments: ANTERIOR CERVICAL DECOMPRESSION/DISCECTOMY FUSION 2 LEVELS (N/A) - Cervical Four-Five Cervical Five-Six Anterior cervical decompression/diskectomy/fusion with allograft, local autograft and plate  SURGEON:  Surgeon(s) and Role:    * Maeola Harman, MD - Primary    * Temple Pacini, MD - Assisting  PHYSICIAN ASSISTANT:   ASSISTANTS: Poteat, RN   ANESTHESIA:   general  EBL:  Total I/O In: 1500 [I.V.:1500] Out: -   BLOOD ADMINISTERED:none  DRAINS: none   LOCAL MEDICATIONS USED:  LIDOCAINE   SPECIMEN:  No Specimen  DISPOSITION OF SPECIMEN:  N/A  COUNTS:  YES  TOURNIQUET:  * No tourniquets in log *  DICTATION: DICTATION: Patient is 66 year old male with bilateral arm pain and weakness with HNP, spondylosis, stenosis, cord edema on MRI and  radiculopathy C45 and C56  PROCEDURE: Patient was brought to operating room and following the smooth and uncomplicated induction of general endotracheal anesthesia his head was placed on a horseshoe head holder he was placed in 5 pounds of Holter traction and his anterior neck was prepped and draped in usual sterile fashion. An incision was made on the left side of midline after infiltrating the skin and subcutaneous tissues with local lidocaine. The platysmal layer was incised and subplatysmal dissection was performed exposing the anterior border sternocleidomastoid muscle. Using blunt dissection the carotid sheath was kept lateral and trachea and esophagus kept medial exposing the anterior  cervical spine. A bent spinal needle was placed it was felt to be the C5/6 level and this was not well visualized.  Needles were moved to C34 and C45 and this was confirmed on intraoperative X-ray. Longus coli muscles were taken down from the anterior cervical spine using electrocautery and key elevator and self-retaining retractor was placed exposing the C45 and C56 levels. The interspaces were incised and a thorough discectomy was performed. Distraction pins were placed. Initially the C45 level was operated. Uncinate spurs and central spondylitic ridges were drilled down with a high-speed drill. The spinal cord dura and both C5 nerve roots were widely decompressed. Hemostasis was assured. After trial sizing a 7mm lordotic allograft bone wedge was selected and packed with autograft. This was tamped into position and countersunk appropriately. Attention was the paid to the C5/6 level, where similar decompression was performed.  Uncinate spurs and central spondylitic ridges were drilled down with a high-speed drill. The spinal cord dura and both C6 nerve roots were widely decompressed. Hemostasis was assured. After trial sizing a 7 mm lordotic allograft bone wedge was selected and packed with autograft. This was tamped into position and countersunk appropriately.Distraction weight was removed. A 34 mm trestle luxe anterior cervical plate was affixed to the cervical spine with 14 mm variable-angle screws 2 at C4, 2 at C5 and 2 at C6. All screws were well-positioned and locking mechanisms were engaged. A final X ray was obtained which showed well positioned grafts and anterior plate without complicating features at the superior-most aspect of the construct. Soft tissues were inspected and found to be in good repair. The wound was irrigated. The platysma layer was closed with 3-0 Vicryl stitches and  the skin was reapproximated with 3-0 Vicryl subcuticular stitches. The wound was dressed with Dermabond. Counts were  correct at the end of the case. Patient was extubated and taken to recovery in stable and satisfactory condition.    PLAN OF CARE: Admit for overnight observation  PATIENT DISPOSITION:  PACU - hemodynamically stable.   Delay start of Pharmacological VTE agent (>24hrs) due to surgical blood loss or risk of bleeding: yes

## 2012-09-19 NOTE — Op Note (Signed)
09/19/2012  5:41 PM  PATIENT:  Kyle Hatfield  66 y.o. male  PRE-OPERATIVE DIAGNOSIS:  Cervical herniated nucleus pulposus with myelopathy, Cervical spondylosis with myelopathy, Cervical stenosis, Cervical radiculopathy C 45 and C 56  POST-OPERATIVE DIAGNOSIS:  Cervical herniated nucleus pulposus with myelopathy, Cervical spondylosis with myelopathy, Cervical stenosis, Cervical radiculopathy C 45 and C 56  PROCEDURE:  Procedure(s) (LRB) with comments: ANTERIOR CERVICAL DECOMPRESSION/DISCECTOMY FUSION 2 LEVELS (N/A) - Cervical Four-Five Cervical Five-Six Anterior cervical decompression/diskectomy/fusion with allograft, local autograft and plate  SURGEON:  Surgeon(s) and Role:    * Prince Olivier, MD - Primary    * Henry A Pool, MD - Assisting  PHYSICIAN ASSISTANT:   ASSISTANTS: Poteat, RN   ANESTHESIA:   general  EBL:  Total I/O In: 1500 [I.V.:1500] Out: -   BLOOD ADMINISTERED:none  DRAINS: none   LOCAL MEDICATIONS USED:  LIDOCAINE   SPECIMEN:  No Specimen  DISPOSITION OF SPECIMEN:  N/A  COUNTS:  YES  TOURNIQUET:  * No tourniquets in log *  DICTATION: DICTATION: Patient is 66 year old male with bilateral arm pain and weakness with HNP, spondylosis, stenosis, cord edema on MRI and  radiculopathy C45 and C56  PROCEDURE: Patient was brought to operating room and following the smooth and uncomplicated induction of general endotracheal anesthesia his head was placed on a horseshoe head holder he was placed in 5 pounds of Holter traction and his anterior neck was prepped and draped in usual sterile fashion. An incision was made on the left side of midline after infiltrating the skin and subcutaneous tissues with local lidocaine. The platysmal layer was incised and subplatysmal dissection was performed exposing the anterior border sternocleidomastoid muscle. Using blunt dissection the carotid sheath was kept lateral and trachea and esophagus kept medial exposing the anterior  cervical spine. A bent spinal needle was placed it was felt to be the C5/6 level and this was not well visualized.  Needles were moved to C34 and C45 and this was confirmed on intraoperative X-ray. Longus coli muscles were taken down from the anterior cervical spine using electrocautery and key elevator and self-retaining retractor was placed exposing the C45 and C56 levels. The interspaces were incised and a thorough discectomy was performed. Distraction pins were placed. Initially the C45 level was operated. Uncinate spurs and central spondylitic ridges were drilled down with a high-speed drill. The spinal cord dura and both C5 nerve roots were widely decompressed. Hemostasis was assured. After trial sizing a 7mm lordotic allograft bone wedge was selected and packed with autograft. This was tamped into position and countersunk appropriately. Attention was the paid to the C5/6 level, where similar decompression was performed.  Uncinate spurs and central spondylitic ridges were drilled down with a high-speed drill. The spinal cord dura and both C6 nerve roots were widely decompressed. Hemostasis was assured. After trial sizing a 7 mm lordotic allograft bone wedge was selected and packed with autograft. This was tamped into position and countersunk appropriately.Distraction weight was removed. A 34 mm trestle luxe anterior cervical plate was affixed to the cervical spine with 14 mm variable-angle screws 2 at C4, 2 at C5 and 2 at C6. All screws were well-positioned and locking mechanisms were engaged. A final X ray was obtained which showed well positioned grafts and anterior plate without complicating features at the superior-most aspect of the construct. Soft tissues were inspected and found to be in good repair. The wound was irrigated. The platysma layer was closed with 3-0 Vicryl stitches and   the skin was reapproximated with 3-0 Vicryl subcuticular stitches. The wound was dressed with Dermabond. Counts were  correct at the end of the case. Patient was extubated and taken to recovery in stable and satisfactory condition.    PLAN OF CARE: Admit for overnight observation  PATIENT DISPOSITION:  PACU - hemodynamically stable.   Delay start of Pharmacological VTE agent (>24hrs) due to surgical blood loss or risk of bleeding: yes  

## 2012-09-19 NOTE — Interval H&P Note (Signed)
History and Physical Interval Note:  09/19/2012 6:22 AM  Kyle Hatfield  has presented today for surgery, with the diagnosis of Cervical hnp with myelopathy, Cervical spondylosis with myelopathy, Cervical stenosis, Cervical radiculopathy  The various methods of treatment have been discussed with the patient and family. After consideration of risks, benefits and other options for treatment, the patient has consented to  Procedure(s) (LRB) with comments: ANTERIOR CERVICAL DECOMPRESSION/DISCECTOMY FUSION 2 LEVELS (N/A) - C4-5 C5-6 Anterior cervical decompression/diskectomy/fusion as a surgical intervention .  The patient's history has been reviewed, patient examined, no change in status, stable for surgery.  I have reviewed the patient's chart and labs.  Questions were answered to the patient's satisfaction.     [name]  Date of Initial H&P: 09/18/2012  History reviewed, patient examined, no change in status, stable for surgery.

## 2012-09-19 NOTE — Anesthesia Postprocedure Evaluation (Signed)
  Anesthesia Post-op Note  Patient: Kyle Hatfield  Procedure(s) Performed: Procedure(s) (LRB) with comments: ANTERIOR CERVICAL DECOMPRESSION/DISCECTOMY FUSION 2 LEVELS (N/A) - Cervical Four-Five Cervical Five-Six Anterior cervical decompression/diskectomy/fusion  Patient Location: PACU  Anesthesia Type:General  Level of Consciousness: awake  Airway and Oxygen Therapy: Patient Spontanous Breathing  Post-op Pain: mild  Post-op Assessment: Post-op Vital signs reviewed  Post-op Vital Signs: Reviewed  Complications: No apparent anesthesia complications

## 2012-09-19 NOTE — Transfer of Care (Signed)
Immediate Anesthesia Transfer of Care Note  Patient: Kyle Hatfield  Procedure(s) Performed: Procedure(s) (LRB) with comments: ANTERIOR CERVICAL DECOMPRESSION/DISCECTOMY FUSION 2 LEVELS (N/A) - Cervical Four-Five Cervical Five-Six Anterior cervical decompression/diskectomy/fusion  Patient Location: PACU  Anesthesia Type:General  Level of Consciousness: sedated  Airway & Oxygen Therapy: Patient Spontanous Breathing and Patient connected to face mask oxygen  Post-op Assessment: Report given to PACU RN  Post vital signs: Reviewed and stable  Complications: No apparent anesthesia complications

## 2012-09-19 NOTE — Progress Notes (Signed)
Awake, alert, conversant.  Full strength bilateral deltoids, biceps, triceps.  MAEW.  Doing well.  

## 2012-09-19 NOTE — Anesthesia Preprocedure Evaluation (Addendum)
Anesthesia Evaluation  Patient identified by MRN, date of birth, ID band Patient awake and Patient confused    Reviewed: Allergy & Precautions, H&P , NPO status , Patient's Chart, lab work & pertinent test results, reviewed documented beta blocker date and time   Airway Mallampati: I TM Distance: >3 FB Neck ROM: Full    Dental  (+) Edentulous Upper and Edentulous Lower   Pulmonary          Cardiovascular hypertension, Pt. on medications Rhythm:Regular Rate:Normal     Neuro/Psych    GI/Hepatic   Endo/Other  Pt denies being diabetic  Renal/GU      Musculoskeletal   Abdominal   Peds  Hematology   Anesthesia Other Findings   Reproductive/Obstetrics                          Anesthesia Physical Anesthesia Plan  ASA: II  Anesthesia Plan: General   Post-op Pain Management:    Induction: Intravenous  Airway Management Planned: Oral ETT  Additional Equipment:   Intra-op Plan:   Post-operative Plan: Extubation in OR  Informed Consent: I have reviewed the patients History and Physical, chart, labs and discussed the procedure including the risks, benefits and alternatives for the proposed anesthesia with the patient or authorized representative who has indicated his/her understanding and acceptance.     Plan Discussed with: CRNA and Surgeon  Anesthesia Plan Comments: (HNP C4-5 C5-6 with myelopathy Htn  Plan GA with oral ETT  Kipp Brood, MD)        Anesthesia Quick Evaluation

## 2012-09-19 NOTE — Anesthesia Postprocedure Evaluation (Signed)
  Anesthesia Post-op Note  Patient: Kyle Hatfield  Procedure(s) Performed: Procedure(s) (LRB) with comments: ANTERIOR CERVICAL DECOMPRESSION/DISCECTOMY FUSION 2 LEVELS (N/A) - Cervical Four-Five Cervical Five-Six Anterior cervical decompression/diskectomy/fusion  Patient Location: PACU  Anesthesia Type:General  Level of Consciousness: awake, alert  and oriented  Airway and Oxygen Therapy: Patient Spontanous Breathing and Patient connected to nasal cannula oxygen  Post-op Pain: mild  Post-op Assessment: Post-op Vital signs reviewed and Patient's Cardiovascular Status Stable  Post-op Vital Signs: stable  Complications: No apparent anesthesia complications

## 2012-09-20 ENCOUNTER — Encounter (HOSPITAL_COMMUNITY): Payer: Self-pay | Admitting: Neurosurgery

## 2012-09-20 MED ORDER — OXYCODONE-ACETAMINOPHEN 5-325 MG PO TABS: 1.0000 | ORAL_TABLET | ORAL | Status: DC | PRN

## 2012-09-20 MED ORDER — PANTOPRAZOLE SODIUM 40 MG PO TBEC: 40.0000 mg | DELAYED_RELEASE_TABLET | Freq: Every day | ORAL | Status: DC

## 2012-09-20 MED ORDER — DIAZEPAM 5 MG PO TABS: 5.0000 mg | ORAL_TABLET | Freq: Four times a day (QID) | ORAL | Status: DC | PRN

## 2012-09-20 NOTE — Discharge Summary (Signed)
Physician Discharge Summary  Patient ID: CLEAVE TERNES MRN: [id number] DOB/AGE: 02/18/1946 66 y.o.  Admit date: 09/19/2012 Discharge date: 09/20/2012  Admission Diagnoses: Cervical herniated nucleus pulposus with myelopathy, Cervical spondylosis with myelopathy, Cervical stenosis, Cervical radiculopathy C 45 and C 56    Discharge Diagnoses: Cervical herniated nucleus pulposus with myelopathy, Cervical spondylosis with myelopathy, Cervical stenosis, Cervical radiculopathy C 45 and C 56  S/p ANTERIOR CERVICAL DECOMPRESSION/DISCECTOMY FUSION 2 LEVELS (N/A) - Cervical Four-Five Cervical Five-Six Anterior cervical decompression/diskectomy/fusion with allograft, local autograft and plate    Active Problems:  * No active hospital problems. *    Discharged Condition: good  Hospital Course: Gedalya Jim was admitted 09-19-12 for surgery for HNP, spondylosis, and stenosis with myelopathy.  He recovered nicely in [facility] and transferred to 3500 for overnight observation.   Consults: None  Significant Diagnostic Studies: radiology: X-Ray: intra-operative  Treatments: surgery: ANTERIOR CERVICAL DECOMPRESSION/DISCECTOMY FUSION 2 LEVELS (N/A) - Cervical Four-Five Cervical Five-Six Anterior cervical decompression/diskectomy/fusion with allograft, local autograft and plate   Discharge Exam: Blood pressure 149/78, pulse 113, temperature 99 F (37.2 C), temperature source Oral, resp. rate 16, height 5\' 9"  (1.753 m), weight 95.255 kg (210 lb), SpO2 94.00%. Alert, conversant. Good strength BUE - improved. Decreased numbness/tingling in hands. Incision with Dermabond. No swelling, erythema, or tenderness. No dysphagia.  Disposition: D/C to home. Pt verbalizes understanding of d/c instructions (& has office printed copy in possession). Pt will call office to scvhedule 3-4 week followup with Dr. Venetia Maxon. Rx's to pt: Valium 5mg  1poq6hrs prn spasm #40; Oxycodone 5/325 1-2 po q4hrs prn pain  #60.  Discharge Orders    Future Appointments: Provider: Department: Dept Phone: Center:   11/06/2012 8:00 AM Wanda Plump, MD [facility]  [place] [phone] [facility]       Medication List     As of 09/20/2012  8:18 AM    TAKE these medications         aspirin 81 MG tablet   Take 81 mg by mouth daily.      atenolol 50 MG tablet   Commonly known as: TENORMIN   Take 50 mg by mouth daily.      diazepam 5 MG tablet   Commonly known as: VALIUM   Take 1 tablet (5 mg total) by mouth every 6 (six) hours as needed.      enalapril 20 MG tablet   Commonly known as: VASOTEC   Take 20 mg by mouth daily.      fenofibrate 160 MG tablet   Take 160 mg by mouth daily.      oxyCODONE-acetaminophen 5-325 MG per tablet   Commonly known as: PERCOCET/ROXICET   Take 1-2 tablets by mouth every 4 (four) hours as needed.      simvastatin 40 MG tablet   Commonly known as: ZOCOR   Take 40 mg by mouth every evening.         Signed: Georgiann Cocker 09/20/2012, 8:18 AM

## 2012-09-20 NOTE — Progress Notes (Addendum)
PT Cancellation Note  Patient Details Name: Kyle Hatfield MRN: [id number] DOB: 08-Feb-1946   Cancelled Treatment:   Pt independent with all mobility. Pt aware of precautions.  No need for PT at this time.   [name] 09/20/2012, 9:53 AM  Skip Mayer PT [phone]

## 2012-09-20 NOTE — Discharge Summary (Signed)
As above.

## 2012-09-20 NOTE — Progress Notes (Signed)
Pt doing well. Pt and wife given D/C instructions with Rx's. Pt verbalized understanding of teaching. Pt D/C'd home via wheelchair @ 1115 per MD order. Rema Fendt, RN

## 2012-09-20 NOTE — Progress Notes (Signed)
OT Cancellation Note  Patient Details Name: Kyle Hatfield MRN: [id number] DOB: [birth year]   Cancelled Treatment:     Pt able to verbalize cervical precautions and states he has necessary assist at home. Pt is I with ambulation and reports improvement in numbness/tingling in UE's as compared to before surgery. No further acute OT needs indicated at this time. Will sign off.  Glendale Chard, OTR/L Pager: [phone] 09/20/2012    [name] 09/20/2012, 8:32 AM

## 2012-10-05 ENCOUNTER — Telehealth: Payer: Self-pay | Admitting: Internal Medicine

## 2012-10-05 NOTE — Telephone Encounter (Signed)
Patient recently had neck surgery, please check on him. How is he doing?

## 2012-10-05 NOTE — Telephone Encounter (Signed)
thx

## 2012-10-05 NOTE — Telephone Encounter (Signed)
Pt states he is doing really good. He is going to see Dr. Meredith Mody next Wednesday for a follow-up.

## 2012-10-23 ENCOUNTER — Other Ambulatory Visit: Payer: Self-pay | Admitting: Internal Medicine

## 2012-10-23 MED ORDER — ENALAPRIL MALEATE 20 MG PO TABS: 20.0000 mg | ORAL_TABLET | Freq: Every day | ORAL | Status: DC

## 2012-10-23 MED ORDER — FENOFIBRATE 160 MG PO TABS: 160.0000 mg | ORAL_TABLET | Freq: Every day | ORAL | Status: DC

## 2012-10-23 MED ORDER — ATENOLOL 50 MG PO TABS: 50.0000 mg | ORAL_TABLET | Freq: Every day | ORAL | Status: DC

## 2012-10-23 MED ORDER — SIMVASTATIN 40 MG PO TABS: 40.0000 mg | ORAL_TABLET | Freq: Every evening | ORAL | Status: DC

## 2012-10-23 NOTE — Telephone Encounter (Signed)
Refill done.  

## 2012-10-23 NOTE — Telephone Encounter (Signed)
ATENOLOL TABLETS 50MG  QTY: 90 TAKE 1 BY MOUTH DAILY  ENALAPRIL MALEATE TABLET 10 MG Q TY:90 TAKE 1 BY MOUTH BAILY  FENOFIBRATE TABLET 160 MG QTY:90 TAKE 1 BY MOUTH DAILY

## 2012-10-23 NOTE — Telephone Encounter (Signed)
Refill: Simvastatin 40 mg tablet. Take 1 by mouth daily. 90 day supply

## 2012-11-06 ENCOUNTER — Ambulatory Visit (INDEPENDENT_AMBULATORY_CARE_PROVIDER_SITE_OTHER): Payer: BC Managed Care – PPO | Admitting: Internal Medicine

## 2012-11-06 ENCOUNTER — Encounter: Payer: Self-pay | Admitting: Internal Medicine

## 2012-11-06 VITALS — BP 144/86 | HR 61 | Temp 98.0°F | Wt 209.0 lb

## 2012-11-06 DIAGNOSIS — R202 Paresthesia of skin: Secondary | ICD-10-CM

## 2012-11-06 DIAGNOSIS — R209 Unspecified disturbances of skin sensation: Secondary | ICD-10-CM

## 2012-11-06 DIAGNOSIS — E785 Hyperlipidemia, unspecified: Secondary | ICD-10-CM

## 2012-11-06 DIAGNOSIS — I10 Essential (primary) hypertension: Secondary | ICD-10-CM

## 2012-11-06 LAB — BASIC METABOLIC PANEL WITH GFR
BUN: 13 mg/dL (ref 6–23)
CO2: 26 meq/L (ref 19–32)
Calcium: 9.3 mg/dL (ref 8.4–10.5)
Chloride: 103 meq/L (ref 96–112)
Creatinine, Ser: 1.2 mg/dL (ref 0.4–1.5)
GFR: 64.58 mL/min
Glucose, Bld: 110 mg/dL — ABNORMAL HIGH (ref 70–99)
Potassium: 4 meq/L (ref 3.5–5.1)
Sodium: 138 meq/L (ref 135–145)

## 2012-11-06 NOTE — Assessment & Plan Note (Signed)
Symptoms are related to cervical herniated disc, status post surgery 09-19-2012, doing well.

## 2012-11-06 NOTE — Progress Notes (Signed)
  Subjective:    Patient ID: Kyle Hatfield, male    DOB: 01/17/1946, 66 y.o.   MRN: [id number]  HPI Routine office visit Since the last time he was here, he had neck surgery, doing great, had very little pain after surgery now is essentially asymptomatic, back working .  Past Medical History  Diagnosis Date  . Hyperlipidemia   . Hypertension   . Diabetes mellitus      dx w/ an  A1c 6.0 2011    Past Surgical History  Procedure Date  . No past surgeries   . Anterior cervical decomp/discectomy fusion 09/19/2012    Procedure: ANTERIOR CERVICAL DECOMPRESSION/DISCECTOMY FUSION 2 LEVELS;  Surgeon: Maeola Harman, MD;  Location: [facility];  Service: Neurosurgery;  Laterality: N/A;  Cervical Four-Five Cervical Five-Six Anterior cervical decompression/diskectomy/fusion    Review of Systems Meds list reviewed, good compliance. Not taking any pain medicine. Had paresthesias in the upper extremities --->  improved. Had some difficulty with his gait, that is resolved.     Objective:   Physical Exam General -- alert, well-developed Lungs -- normal respiratory effort, no intercostal retractions, no accessory muscle use, and normal breath sounds.   Heart-- normal rate, regular rhythm, no murmur, and no gallop.   Extremities-- no pretibial edema bilaterally Psych-- Cognition and judgment appear intact. Alert and cooperative with normal attention span and concentration.  not anxious appearing and not depressed appearing.      Assessment & Plan:

## 2012-11-06 NOTE — Assessment & Plan Note (Signed)
Reassess on return to the office 

## 2012-11-06 NOTE — Assessment & Plan Note (Addendum)
Good compliance with meds, check a BMP.  BP slightly elevated today, no ambulatory BPs. I asked patient to check his BP to 3 times a week, will call if it is consistently more than 140/80.

## 2012-11-07 ENCOUNTER — Encounter: Payer: Self-pay | Admitting: *Deleted

## 2013-03-01 IMAGING — CR DG CHEST 2V
2 series · 2 of 2 positions shown · non-contrast
Comparison: None.

CLINICAL DATA: Preop ACDF

CHEST - 2 VIEW

[view not recorded (1 of 2)]
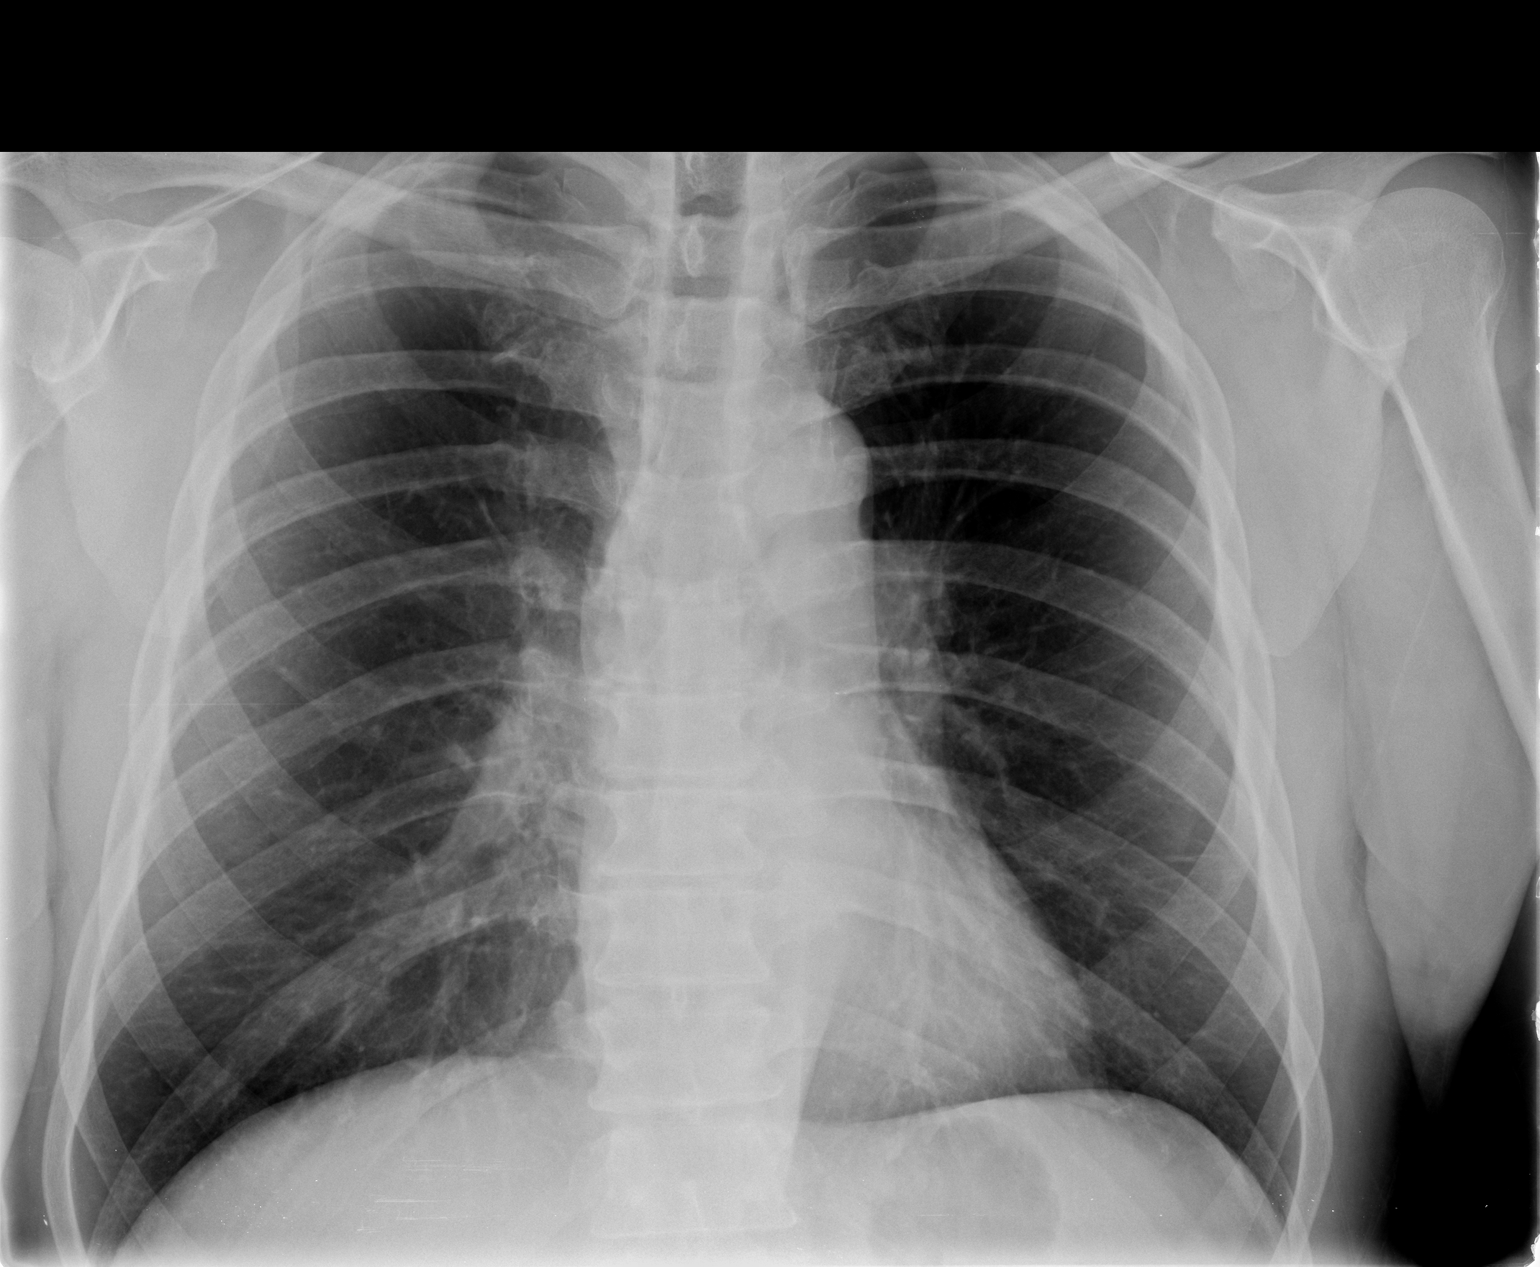

[view not recorded (2 of 2)]
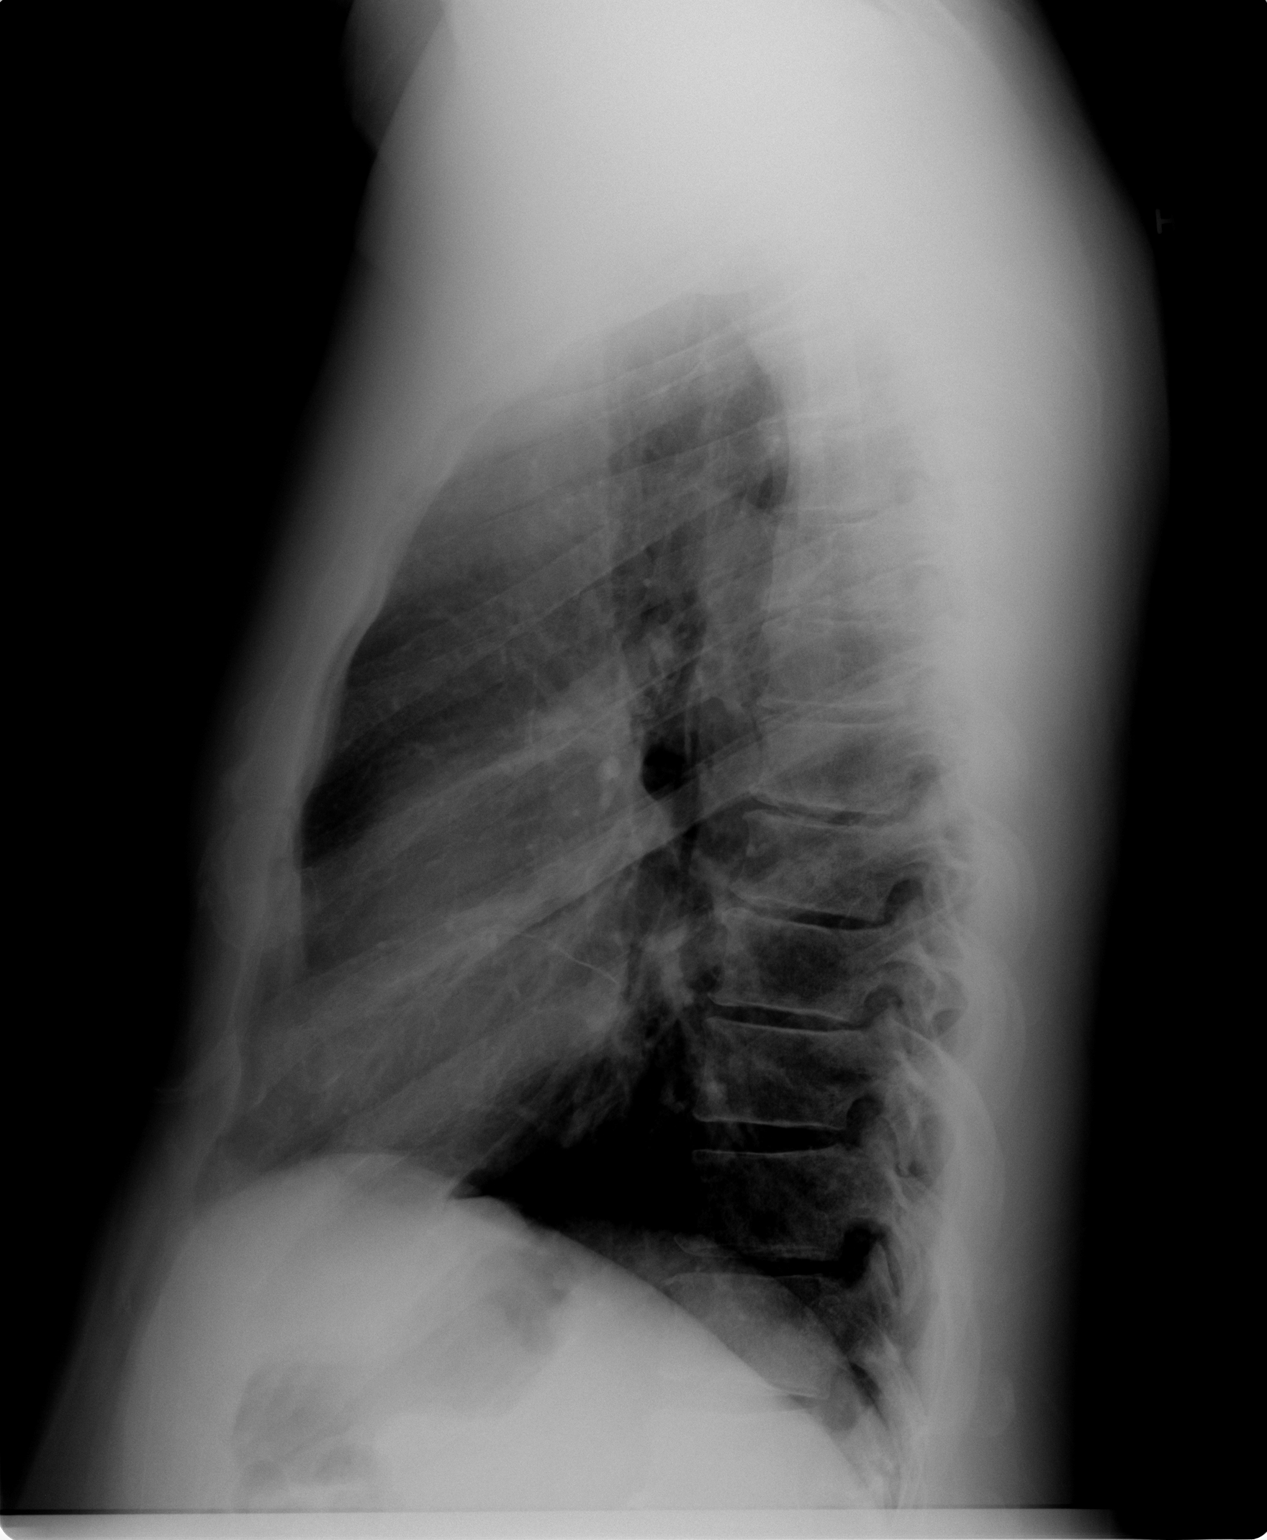

[2 of 2 positions shown; findings below may reference images not displayed]

FINDINGS: Lungs are clear. No pleural effusion or pneumothorax.

Cardiomediastinal silhouette is within normal limits.

Mild degenerative changes of the visualized thoracolumbar spine.
IMPRESSION: No evidence of acute cardiopulmonary disease.

## 2013-04-19 ENCOUNTER — Telehealth: Payer: Self-pay | Admitting: Internal Medicine

## 2013-04-19 NOTE — Telephone Encounter (Signed)
Patient states that he has "chiggers" and would like to know if Dr. Drue Novel recommends any OTC medicine or ointment that he could use?

## 2013-04-20 NOTE — Telephone Encounter (Signed)
Pt notified of Dr. Leta Jungling directions.

## 2013-04-20 NOTE — Telephone Encounter (Signed)
Vigorous cleansing with soap and water may help to remove the mites Use hydrocortisone 1% cream OTC as needed. If he's not better or if he is not sure chiggers is what he has ---> needs office visit.

## 2013-04-20 NOTE — Telephone Encounter (Signed)
Left message to call office

## 2013-05-07 ENCOUNTER — Encounter: Payer: Medicare Other | Admitting: Internal Medicine

## 2013-05-25 ENCOUNTER — Telehealth: Payer: Self-pay | Admitting: Internal Medicine

## 2013-05-25 MED ORDER — ATENOLOL 50 MG PO TABS: 50.0000 mg | ORAL_TABLET | Freq: Every day | ORAL | Status: DC

## 2013-05-25 MED ORDER — ENALAPRIL MALEATE 20 MG PO TABS: 20.0000 mg | ORAL_TABLET | Freq: Every day | ORAL | Status: DC

## 2013-05-25 NOTE — Telephone Encounter (Signed)
Rx sent 

## 2013-05-25 NOTE — Telephone Encounter (Signed)
Patient's wife states that the patient is almost out of his prescriptions Atenolol and Enalapril. He needs them sent to [facility].

## 2013-06-06 ENCOUNTER — Encounter: Payer: Self-pay | Admitting: Internal Medicine

## 2013-06-06 ENCOUNTER — Ambulatory Visit (INDEPENDENT_AMBULATORY_CARE_PROVIDER_SITE_OTHER): Payer: BC Managed Care – PPO | Admitting: Internal Medicine

## 2013-06-06 VITALS — BP 165/90 | HR 71 | Temp 98.2°F | Ht 68.25 in | Wt 214.0 lb

## 2013-06-06 DIAGNOSIS — E119 Type 2 diabetes mellitus without complications: Secondary | ICD-10-CM

## 2013-06-06 DIAGNOSIS — E785 Hyperlipidemia, unspecified: Secondary | ICD-10-CM

## 2013-06-06 DIAGNOSIS — Z0000 Encounter for general adult medical examination without abnormal findings: Secondary | ICD-10-CM

## 2013-06-06 DIAGNOSIS — Z23 Encounter for immunization: Secondary | ICD-10-CM

## 2013-06-06 DIAGNOSIS — I10 Essential (primary) hypertension: Secondary | ICD-10-CM

## 2013-06-06 LAB — CBC WITH DIFFERENTIAL/PLATELET
Basophils Absolute: 0 10*3/uL (ref 0.0–0.1)
Eosinophils Absolute: 0.1 10*3/uL (ref 0.0–0.7)
Hemoglobin: 15.6 g/dL (ref 13.0–17.0)
Lymphocytes Relative: 19.7 % (ref 12.0–46.0)
MCHC: 33.6 g/dL (ref 30.0–36.0)
Monocytes Relative: 11.6 % (ref 3.0–12.0)
Neutrophils Relative %: 67.3 % (ref 43.0–77.0)
RDW: 13 % (ref 11.5–14.6)

## 2013-06-06 LAB — LIPID PANEL
Cholesterol: 147 mg/dL (ref 0–200)
HDL: 36.4 mg/dL — ABNORMAL LOW (ref >39.00)
Triglycerides: 131 mg/dL (ref 0.0–149.0)
VLDL: 26.2 mg/dL (ref 0.0–40.0)

## 2013-06-06 LAB — COMPREHENSIVE METABOLIC PANEL
ALT: 26 U/L (ref 0–53)
CO2: 28 mEq/L (ref 19–32)
Calcium: 9.5 mg/dL (ref 8.4–10.5)
Chloride: 101 mEq/L (ref 96–112)
Creatinine, Ser: 1.3 mg/dL (ref 0.4–1.5)
GFR: 60.38 mL/min (ref >60.00)
Glucose, Bld: 101 mg/dL — ABNORMAL HIGH (ref 70–99)
Sodium: 138 mEq/L (ref 135–145)
Total Bilirubin: 0.7 mg/dL (ref 0.3–1.2)
Total Protein: 7.1 g/dL (ref 6.0–8.3)

## 2013-06-06 LAB — HEMOGLOBIN A1C: Hgb A1c MFr Bld: 5.8 % (ref 4.6–6.5)

## 2013-06-06 LAB — PSA: PSA: 3.25 ng/mL (ref 0.10–4.00)

## 2013-06-06 MED ORDER — LOSARTAN POTASSIUM 100 MG PO TABS: 100.0000 mg | ORAL_TABLET | Freq: Every day | ORAL | Status: DC

## 2013-06-06 NOTE — Assessment & Plan Note (Addendum)
Good compliance w/ medication, due for labs.

## 2013-06-06 NOTE — Assessment & Plan Note (Signed)
Due for labs

## 2013-06-06 NOTE — Assessment & Plan Note (Addendum)
Td   2012 pneumonia shot today  shingles shot discussed , declined   Colonoscopy Vs.iFOB reviewed, pro-cons discussed. Previous iFOBs (-) , provided iFOB Continue with his healthy lifestyle.

## 2013-06-06 NOTE — Progress Notes (Signed)
  Subjective:    Patient ID: Kyle Hatfield, male    DOB: April 21, 1946, 67 y.o.   MRN: [id number]  HPI CPX  Past Medical History  Diagnosis Date  . Hyperlipidemia   . Hypertension   . Diabetes mellitus      dx w/ an  A1c 6.0 2011   Past Surgical History  Procedure Laterality Date  . Anterior cervical decomp/discectomy fusion  09/19/2012    Procedure: ANTERIOR CERVICAL DECOMPRESSION/DISCECTOMY FUSION 2 LEVELS;  Surgeon: Maeola Harman, MD;  Location: [facility];  Service: Neurosurgery;  Laterality: N/A;  Cervical Four-Five Cervical Five-Six Anterior cervical decompression/diskectomy/fusion   History   Social History  . Marital Status: Married    Spouse Name: N/A    Number of Children: N/A  . Years of Education: N/A   Occupational History  . retired    Social History Main Topics  . Smoking status: Former Games developer  . Smokeless tobacco: Never Used     Comment: quit in the 80s  . Alcohol Use: Yes     Comment: rarely  . Drug Use: No  . Sexually Active: Not on file   Other Topics Concern  . Not on file   Social History Narrative   Married, has no children of his own but he does have step kids   former Electronics engineer, Product manager, was deployed in [place]            Family History  Problem Relation Age of Onset  . Prostate cancer Father     at age 38  . Colon cancer Neg Hx   . Diabetes Neg Hx   . CAD Mother     Mother MI at age 52  . Stroke Neg Hx       Review of Systems Diet-- regular/healthy Exercise 3-4 times a week, walks 1-1.5 miles. Good medication compliance. No ambulatory BPs or ambulatory blood sugars. No chest pain or shortness or breath Denies nausea, vomiting, diarrhea or blood in the stools. No dysuria gross hematuria. No anxiety depression.     Objective:   Physical Exam BP 165/90  Pulse 71  Temp(Src) 98.2 F (36.8 C) (Oral)  Ht 5' 8.25" (1.734 m)  Wt 214 lb (97.07 kg)  BMI 32.28 kg/m2  SpO2 97%  General -- alert, well-developed, NAD.   Neck  --no thyromegaly , normal carotid pulse Lungs -- normal respiratory effort, no intercostal retractions, no accessory muscle use, and normal breath sounds.   Heart-- normal rate, regular rhythm, no murmur, and no gallop.   Abdomen--soft, non-tender, no distention, no masses, no HSM, no guarding, and no rigidity.   Extremities-- no pretibial edema bilaterally Rectal-- No external abnormalities noted. Normal sphincter tone. No rectal masses or tenderness. Brown stool, Hemoccult negative Prostate:  Prostate gland firm and smooth, slt  Enlargement; no  nodularity, tenderness, mass, asymmetry or induration. Neurologic-- alert & oriented X3 and strength normal in all extremities. Psych-- Cognition and judgment appear intact. Alert and cooperative with normal attention span and concentration.  not anxious appearing and not depressed appearing.       Assessment & Plan:

## 2013-06-06 NOTE — Assessment & Plan Note (Signed)
No ambulatory BPs, BP today elevated, was elevated the last time as well. Plan: Discontinue Vasotec 20 mg, start losartan 100 mg, BMP in one month, see instructions

## 2013-06-06 NOTE — Patient Instructions (Addendum)
Stop Vasotec, start losartan 100 mg one tablet daily. Check the  blood pressure 2 or 3 times a week, be sure it is between 110/60 and 140/85. If it is consistently higher or lower, let me know Please come back in one month for blood work only, arrange a lab appointment ( BMP hypertension) Next visit to see me in 6 months  Fall Prevention and Home Safety Falls cause injuries and can affect all age groups. It is possible to use preventive measures to significantly decrease the likelihood of falls. There are many simple measures which can make your home safer and prevent falls. OUTDOORS  Repair cracks and edges of walkways and driveways.  Remove high doorway thresholds.  Trim shrubbery on the main path into your home.  Have good outside lighting.  Clear walkways of tools, rocks, debris, and clutter.  Check that handrails are not broken and are securely fastened. Both sides of steps should have handrails.  Have leaves, snow, and ice cleared regularly.  Use sand or salt on walkways during winter months.  In the garage, clean up grease or oil spills. BATHROOM  Install night lights.  Install grab bars by the toilet and in the tub and shower.  Use non-skid mats or decals in the tub or shower.  Place a plastic non-slip stool in the shower to sit on, if needed.  Keep floors dry and clean up all water on the floor immediately.  Remove soap buildup in the tub or shower on a regular basis.  Secure bath mats with non-slip, double-sided rug tape.  Remove throw rugs and tripping hazards from the floors. BEDROOMS  Install night lights.  Make sure a bedside light is easy to reach.  Do not use oversized bedding.  Keep a telephone by your bedside.  Have a firm chair with side arms to use for getting dressed.  Remove throw rugs and tripping hazards from the floor. KITCHEN  Keep handles on pots and pans turned toward the center of the stove. Use back burners when  possible.  Clean up spills quickly and allow time for drying.  Avoid walking on wet floors.  Avoid hot utensils and knives.  Position shelves so they are not too high or low.  Place commonly used objects within easy reach.  If necessary, use a sturdy step stool with a grab bar when reaching.  Keep electrical cables out of the way.  Do not use floor polish or wax that makes floors slippery. If you must use wax, use non-skid floor wax.  Remove throw rugs and tripping hazards from the floor. STAIRWAYS  Never leave objects on stairs.  Place handrails on both sides of stairways and use them. Fix any loose handrails. Make sure handrails on both sides of the stairways are as long as the stairs.  Check carpeting to make sure it is firmly attached along stairs. Make repairs to worn or loose carpet promptly.  Avoid placing throw rugs at the top or bottom of stairways, or properly secure the rug with carpet tape to prevent slippage. Get rid of throw rugs, if possible.  Have an electrician put in a light switch at the top and bottom of the stairs. OTHER FALL PREVENTION TIPS  Wear low-heel or rubber-soled shoes that are supportive and fit well. Wear closed toe shoes.  When using a stepladder, make sure it is fully opened and both spreaders are firmly locked. Do not climb a closed stepladder.  Add color or contrast paint or tape  to grab bars and handrails in your home. Place contrasting color strips on first and last steps.  Learn and use mobility aids as needed. Install an electrical emergency response system.  Turn on lights to avoid dark areas. Replace light bulbs that burn out immediately. Get light switches that glow.  Arrange furniture to create clear pathways. Keep furniture in the same place.  Firmly attach carpet with non-skid or double-sided tape.  Eliminate uneven floor surfaces.  Select a carpet pattern that does not visually hide the edge of steps.  Be aware of all  pets. OTHER HOME SAFETY TIPS  Set the water temperature for 120 F (48.8 C).  Keep emergency numbers on or near the telephone.  Keep smoke detectors on every level of the home and near sleeping areas. Document Released: 10/22/2002 Document Revised: 05/02/2012 Document Reviewed: 01/21/2012 [facility] Patient Information 2014 [place].

## 2013-06-07 ENCOUNTER — Encounter: Payer: Self-pay | Admitting: *Deleted

## 2013-06-19 ENCOUNTER — Other Ambulatory Visit (INDEPENDENT_AMBULATORY_CARE_PROVIDER_SITE_OTHER): Payer: BC Managed Care – PPO

## 2013-06-19 DIAGNOSIS — Z0000 Encounter for general adult medical examination without abnormal findings: Secondary | ICD-10-CM

## 2013-07-09 ENCOUNTER — Other Ambulatory Visit (INDEPENDENT_AMBULATORY_CARE_PROVIDER_SITE_OTHER): Payer: BC Managed Care – PPO

## 2013-07-09 DIAGNOSIS — I10 Essential (primary) hypertension: Secondary | ICD-10-CM

## 2013-07-09 LAB — BASIC METABOLIC PANEL
BUN: 12 mg/dL (ref 6–23)
CO2: 28 mEq/L (ref 19–32)
Chloride: 102 mEq/L (ref 96–112)
Creatinine, Ser: 1.3 mg/dL (ref 0.4–1.5)
Glucose, Bld: 101 mg/dL — ABNORMAL HIGH (ref 70–99)
Potassium: 5 mEq/L (ref 3.5–5.1)

## 2013-07-10 ENCOUNTER — Telehealth: Payer: Self-pay | Admitting: Internal Medicine

## 2013-07-10 NOTE — Telephone Encounter (Signed)
Patient states that his blood pressure medication was changed and we need to call his mail order pharmacy or fax over a new script with the changes so that they can send him the updated prescription when he is ready for it to be refilled.

## 2013-07-11 ENCOUNTER — Telehealth: Payer: Self-pay | Admitting: *Deleted

## 2013-07-11 NOTE — Telephone Encounter (Signed)
error 

## 2013-07-11 NOTE — Telephone Encounter (Signed)
Called patient to confirm which medication was changed. Found no note indicating medication changes or updated prescription wanted to verify but pt was highly agitated and decided to hang up the phone.Marland Kitchen

## 2013-07-11 NOTE — Telephone Encounter (Signed)
Patient was recommended to switch from Vasotec to losartan at the time of his physical 06-06-2013, subsequently BMP okay. What he needs is to continue with losartan daily, call for a prescription (if he likes 47 das and 1 RF is ok). Also he needs to let us know if BP is well controlled

## 2013-07-12 ENCOUNTER — Other Ambulatory Visit: Payer: Self-pay | Admitting: *Deleted

## 2013-07-12 ENCOUNTER — Telehealth: Payer: Self-pay | Admitting: *Deleted

## 2013-07-12 MED ORDER — LOSARTAN POTASSIUM 100 MG PO TABS: 100.0000 mg | ORAL_TABLET | Freq: Every day | ORAL | Status: DC

## 2013-07-12 NOTE — Telephone Encounter (Signed)
Called and spoke with patient about his medication change and he stated that he was doing well with the new medicine  and he would like a 90 day refill. Patient also stated that he was checking his BP regularly.  I have passed this refill request along  to [facility] Dr. Leta Jungling cma.  Ag cma

## 2013-07-12 NOTE — Telephone Encounter (Signed)
rx refilled per protocol  

## 2013-07-13 ENCOUNTER — Encounter: Payer: Self-pay | Admitting: General Practice

## 2013-08-15 ENCOUNTER — Other Ambulatory Visit: Payer: Self-pay | Admitting: *Deleted

## 2013-08-15 MED ORDER — ATENOLOL 50 MG PO TABS: 50.0000 mg | ORAL_TABLET | Freq: Every day | ORAL | Status: DC

## 2013-08-15 MED ORDER — SIMVASTATIN 40 MG PO TABS: 40.0000 mg | ORAL_TABLET | Freq: Every evening | ORAL | Status: DC

## 2013-08-15 MED ORDER — FENOFIBRATE 160 MG PO TABS: 160.0000 mg | ORAL_TABLET | Freq: Every day | ORAL | Status: DC

## 2013-08-15 NOTE — Telephone Encounter (Signed)
Refills sent to [facility].

## 2013-10-15 ENCOUNTER — Telehealth: Payer: Self-pay | Admitting: Internal Medicine

## 2013-10-15 ENCOUNTER — Other Ambulatory Visit: Payer: Self-pay | Admitting: *Deleted

## 2013-10-15 MED ORDER — FENOFIBRATE 160 MG PO TABS: 160.0000 mg | ORAL_TABLET | Freq: Every day | ORAL | Status: DC

## 2013-10-15 MED ORDER — ATENOLOL 50 MG PO TABS: 50.0000 mg | ORAL_TABLET | Freq: Every day | ORAL | Status: DC

## 2013-10-15 MED ORDER — LOSARTAN POTASSIUM 100 MG PO TABS: 100.0000 mg | ORAL_TABLET | Freq: Every day | ORAL | Status: DC

## 2013-10-15 NOTE — Telephone Encounter (Signed)
6 month supply of the meds below sent to [facility] pharmacy per patients request. JG//CMA

## 2013-10-15 NOTE — Telephone Encounter (Signed)
6 month supply of Atenolol, Fenofibrate and Losartan sent to [facility] pharmacy per protocol.

## 2013-10-15 NOTE — Telephone Encounter (Signed)
Patient's spouse called and states that rx's below have expired with [facility] and he needs new prescriptions sent to them. Please advise.   atenolol (TENORMIN) 50 MG tablet, fenofibrate 160 MG tablet, losartan (COZAAR) 100 MG tablet

## 2013-11-26 ENCOUNTER — Ambulatory Visit (INDEPENDENT_AMBULATORY_CARE_PROVIDER_SITE_OTHER): Payer: BC Managed Care – PPO | Admitting: Internal Medicine

## 2013-11-26 ENCOUNTER — Encounter: Payer: Self-pay | Admitting: Internal Medicine

## 2013-11-26 VITALS — BP 166/84 | HR 70 | Temp 98.0°F | Wt 231.0 lb

## 2013-11-26 DIAGNOSIS — I10 Essential (primary) hypertension: Secondary | ICD-10-CM

## 2013-11-26 DIAGNOSIS — E119 Type 2 diabetes mellitus without complications: Secondary | ICD-10-CM

## 2013-11-26 MED ORDER — HYDROCHLOROTHIAZIDE 25 MG PO TABS: 25.0000 mg | ORAL_TABLET | Freq: Every day | ORAL | Status: DC

## 2013-11-26 NOTE — Patient Instructions (Signed)
Start taking hydrochlorothiazide 25 mg one tablet in the morning. Continue all other medications   Check the  blood pressure  weekly be sure it is between 110/60 and 140/85. Ideal blood pressure is 120/80. If it is consistently higher or lower, let me know  Come back for blood work only (BMP-- dx hypertension) in 2 weeks  Next visit in 6 months for a physical exam as long your BP is at goal

## 2013-11-26 NOTE — Assessment & Plan Note (Addendum)
BP today 160 sees, ambulatory BPs in the 140s. Needs better control, BP goal 120. Add  HCTZ, side effects discussed, see instructions. Check a BMP

## 2013-11-26 NOTE — Progress Notes (Signed)
   Subjective:    Patient ID: Kyle Hatfield, male    DOB: 03-27-1946, 68 y.o.   MRN: [id number]  HPI Routine office visit Hypertension, good medication compliance, who ambulatory BP in the 140s, BP today 166. Diastolic BP at home in the 80s. Diabetes, he remains very active, trying to eat healthy.   Past Medical History  Diagnosis Date  . Hyperlipidemia   . Hypertension   . Diabetes mellitus      dx w/ an  A1c 6.0 2011   Past Surgical History  Procedure Laterality Date  . Anterior cervical decomp/discectomy fusion  09/19/2012    Procedure: ANTERIOR CERVICAL DECOMPRESSION/DISCECTOMY FUSION 2 LEVELS;  Surgeon: Erline Levine, MD;  Location: [place] [facility];  Service: Neurosurgery;  Laterality: N/A;  Cervical Four-Five Cervical Five-Six Anterior cervical decompression/diskectomy/fusion    Review of Systems Good medication compliance without apparent side effects. Denies chest from short of breath. Denies nausea, vomiting, diarrhea. Has minimal paresthesias of the feet and thumb. See assessment and plan    Objective:   Physical Exam BP 166/84  Pulse 70  Temp(Src) 98 F (36.7 C)  Wt 231 lb (104.781 kg)  SpO2 95% General -- alert, well-developed, NAD.  Lungs -- normal respiratory effort, no intercostal retractions, no accessory muscle use, and normal breath sounds.  Heart-- normal rate, regular rhythm, no murmur.  DIABETIC FEET EXAM: No lower extremity edema Normal pedal pulses bilaterally Skin normal, nails normal, no calluses Pinprick examination of the feet normal. Neurologic--  alert & oriented X3. Speech normal, gait normal, strength normal in all extremities.   Psych-- Cognition and judgment appear intact. Cooperative with normal attention span and concentration. No anxious or depressed appearing.      Assessment & Plan:

## 2013-11-26 NOTE — Progress Notes (Signed)
Pre visit review using our clinic review tool, if applicable. No additional management support is needed unless otherwise documented below in the visit note. 

## 2013-11-26 NOTE — Assessment & Plan Note (Addendum)
On diet control. Recommend eye exam yearly, last exam approximately 3 years ago. Has mild paresthesias in the feet -thumbs, Symptoms started before neck surgery and did not completely resolved; denies any pain, burning;  Discussed feet care

## 2013-11-27 ENCOUNTER — Telehealth: Payer: Self-pay

## 2013-11-27 NOTE — Telephone Encounter (Signed)
Relevant patient education mailed to patient.  

## 2013-12-10 ENCOUNTER — Other Ambulatory Visit (INDEPENDENT_AMBULATORY_CARE_PROVIDER_SITE_OTHER): Payer: BC Managed Care – PPO

## 2013-12-10 DIAGNOSIS — I10 Essential (primary) hypertension: Secondary | ICD-10-CM

## 2013-12-10 LAB — BASIC METABOLIC PANEL
BUN: 12 mg/dL (ref 6–23)
CHLORIDE: 100 meq/L (ref 96–112)
CO2: 29 mEq/L (ref 19–32)
CREATININE: 1.4 mg/dL (ref 0.4–1.5)
Calcium: 9 mg/dL (ref 8.4–10.5)
GFR: 54.32 mL/min — ABNORMAL LOW (ref >60.00)
Glucose, Bld: 105 mg/dL — ABNORMAL HIGH (ref 70–99)
POTASSIUM: 4.1 meq/L (ref 3.5–5.1)
Sodium: 139 mEq/L (ref 135–145)

## 2013-12-13 ENCOUNTER — Encounter: Payer: Self-pay | Admitting: *Deleted

## 2013-12-18 ENCOUNTER — Telehealth: Payer: Self-pay | Admitting: Internal Medicine

## 2013-12-18 NOTE — Telephone Encounter (Signed)
Relevant patient education mailed to patient.  

## 2014-01-03 ENCOUNTER — Other Ambulatory Visit: Payer: Self-pay | Admitting: General Practice

## 2014-01-03 MED ORDER — ATENOLOL 50 MG PO TABS: 50.0000 mg | ORAL_TABLET | Freq: Every day | ORAL | Status: DC

## 2014-01-09 ENCOUNTER — Telehealth: Payer: Self-pay | Admitting: *Deleted

## 2014-01-09 MED ORDER — LOSARTAN POTASSIUM 100 MG PO TABS: 100.0000 mg | ORAL_TABLET | Freq: Every day | ORAL | Status: DC

## 2014-01-09 MED ORDER — SIMVASTATIN 40 MG PO TABS: 40.0000 mg | ORAL_TABLET | Freq: Every evening | ORAL | Status: DC

## 2014-01-09 NOTE — Telephone Encounter (Signed)
rx sent per protocol. 

## 2014-01-09 NOTE — Telephone Encounter (Signed)
Patient wife called and requested refills for simvastatin (ZOCOR) 40 MG tablet and losartan (COZAAR) 100 MG tablet    [facility]

## 2014-01-15 ENCOUNTER — Telehealth: Payer: Self-pay

## 2014-01-15 MED ORDER — HYDROCHLOROTHIAZIDE 25 MG PO TABS: 25.0000 mg | ORAL_TABLET | Freq: Every day | ORAL | Status: DC

## 2014-01-15 NOTE — Telephone Encounter (Signed)
Patient presents to the office concerning several phone calls and attempts to get his HCTZ refill sent to the mail order pharmacy. Patient very frustrated with the situation. Sent his refill for HCTZ to [phone] with 1 refill. Patient has follow up schedule for 6 months out and checks his blood pressure regularly. States that his top number has been in the 120's which is at goal. Advised to call me with any concerns or questions. Patient verbalizes understanding and was appreciative for addressing his concerns.

## 2014-02-18 ENCOUNTER — Ambulatory Visit: Payer: BC Managed Care – PPO | Admitting: Internal Medicine

## 2014-03-21 ENCOUNTER — Emergency Department (HOSPITAL_BASED_OUTPATIENT_CLINIC_OR_DEPARTMENT_OTHER)
Admission: EM | Admit: 2014-03-21 | Discharge: 2014-03-21 | Disposition: A | Discharge status: Home / Self Care | Payer: BC Managed Care – PPO | Attending: Emergency Medicine | Admitting: Emergency Medicine

## 2014-03-21 ENCOUNTER — Encounter (HOSPITAL_BASED_OUTPATIENT_CLINIC_OR_DEPARTMENT_OTHER): Payer: Self-pay | Admitting: Emergency Medicine

## 2014-03-21 DIAGNOSIS — Y9389 Activity, other specified: Secondary | ICD-10-CM | POA: Insufficient documentation

## 2014-03-21 DIAGNOSIS — Z79899 Other long term (current) drug therapy: Secondary | ICD-10-CM | POA: Insufficient documentation

## 2014-03-21 DIAGNOSIS — Z880 Allergy status to penicillin: Secondary | ICD-10-CM | POA: Insufficient documentation

## 2014-03-21 DIAGNOSIS — Z87891 Personal history of nicotine dependence: Secondary | ICD-10-CM | POA: Insufficient documentation

## 2014-03-21 DIAGNOSIS — I10 Essential (primary) hypertension: Secondary | ICD-10-CM | POA: Insufficient documentation

## 2014-03-21 DIAGNOSIS — T63441A Toxic effect of venom of bees, accidental (unintentional), initial encounter: Secondary | ICD-10-CM

## 2014-03-21 DIAGNOSIS — E119 Type 2 diabetes mellitus without complications: Secondary | ICD-10-CM | POA: Insufficient documentation

## 2014-03-21 DIAGNOSIS — T63461A Toxic effect of venom of wasps, accidental (unintentional), initial encounter: Secondary | ICD-10-CM | POA: Insufficient documentation

## 2014-03-21 DIAGNOSIS — Y929 Unspecified place or not applicable: Secondary | ICD-10-CM | POA: Insufficient documentation

## 2014-03-21 DIAGNOSIS — T6391XA Toxic effect of contact with unspecified venomous animal, accidental (unintentional), initial encounter: Secondary | ICD-10-CM | POA: Insufficient documentation

## 2014-03-21 DIAGNOSIS — E785 Hyperlipidemia, unspecified: Secondary | ICD-10-CM | POA: Insufficient documentation

## 2014-03-21 DIAGNOSIS — R21 Rash and other nonspecific skin eruption: Secondary | ICD-10-CM | POA: Insufficient documentation

## 2014-03-21 DIAGNOSIS — Z7982 Long term (current) use of aspirin: Secondary | ICD-10-CM | POA: Insufficient documentation

## 2014-03-21 MED ORDER — SODIUM CHLORIDE 0.9 % IV SOLN: Freq: Once | INTRAVENOUS | Status: DC

## 2014-03-21 MED ORDER — EPINEPHRINE 0.3 MG/0.3ML IJ SOAJ: 0.3000 mg | INTRAMUSCULAR | Status: DC | PRN

## 2014-03-21 MED ORDER — DIPHENHYDRAMINE HCL 50 MG/ML IJ SOLN: 12.5000 mg | Freq: Once | INTRAMUSCULAR | Status: DC

## 2014-03-21 MED ORDER — FAMOTIDINE IN NACL 20-0.9 MG/50ML-% IV SOLN: 20.0000 mg | Freq: Once | INTRAVENOUS | Status: DC

## 2014-03-21 MED ORDER — METHYLPREDNISOLONE SODIUM SUCC 125 MG IJ SOLR: 125.0000 mg | Freq: Once | INTRAMUSCULAR | Status: DC

## 2014-03-21 NOTE — ED Provider Notes (Signed)
CSN: [id number]     Arrival date & time 03/21/14  1407 History   First MD Initiated Contact with Patient 03/21/14 1423     Chief Complaint  Patient presents with  . bee stings   . Rash     (Consider location/radiation/quality/duration/timing/severity/associated sxs/prior Treatment) Patient is a 68 y.o. male presenting with rash. The history is provided by the patient. No language interpreter was used.  Rash Location:  Full body Quality: itchiness and redness   Severity:  Moderate Onset quality:  Sudden Duration:  3 hours Timing:  Constant Progression:  Improving Chronicity:  New Relieved by:  Nothing Worsened by:  Nothing tried Ineffective treatments:  None tried Associated symptoms: no shortness of breath and no throat swelling     Past Medical History  Diagnosis Date  . Hyperlipidemia   . Hypertension   . Diabetes mellitus      dx w/ an  A1c 6.0 2011   Past Surgical History  Procedure Laterality Date  . Anterior cervical decomp/discectomy fusion  09/19/2012    Procedure: ANTERIOR CERVICAL DECOMPRESSION/DISCECTOMY FUSION 2 LEVELS;  Surgeon: Erline Levine, MD;  Location: [facility];  Service: Neurosurgery;  Laterality: N/A;  Cervical Four-Five Cervical Five-Six Anterior cervical decompression/diskectomy/fusion   Family History  Problem Relation Age of Onset  . Prostate cancer Father     at age 8  . Colon cancer Neg Hx   . Diabetes Neg Hx   . CAD Mother     Mother MI at age 66  . Stroke Neg Hx    History  Substance Use Topics  . Smoking status: Former Research scientist (life sciences)  . Smokeless tobacco: Never Used     Comment: quit in the 80s  . Alcohol Use: Yes     Comment: rarely    Review of Systems  Respiratory: Negative for shortness of breath.   Skin: Positive for rash.  All other systems reviewed and are negative.     Allergies  Strawberry and Penicillins  Home Medications   Prior to Admission medications   Medication Sig Start Date End Date Taking? Authorizing  Provider  aspirin 81 MG tablet Take 81 mg by mouth daily.      Historical Provider, MD  atenolol (TENORMIN) 50 MG tablet Take 1 tablet (50 mg total) by mouth daily. Office visit due now 01/03/14   Colon Branch, MD  EPINEPHrine (EPIPEN) 0.3 mg/0.3 mL IJ SOAJ injection Inject 0.3 mLs (0.3 mg total) into the muscle as needed. 03/21/14   Fransico Meadow, PA-C  fenofibrate 160 MG tablet Take 1 tablet (160 mg total) by mouth daily. 10/15/13   Colon Branch, MD  hydrochlorothiazide (HYDRODIURIL) 25 MG tablet Take 1 tablet (25 mg total) by mouth daily. 01/15/14   Colon Branch, MD  losartan (COZAAR) 100 MG tablet Take 1 tablet (100 mg total) by mouth daily. 01/09/14   Colon Branch, MD  simvastatin (ZOCOR) 40 MG tablet Take 1 tablet (40 mg total) by mouth every evening. 01/09/14   Colon Branch, MD   BP 161/106  Pulse 88  Temp(Src) 97.8 F (36.6 C) (Oral)  Resp 16  Ht 5\' 9"  (1.753 m)  Wt 225 lb (102.059 kg)  BMI 33.21 kg/m2  SpO2 100% Physical Exam  Nursing note and vitals reviewed. Constitutional: He is oriented to person, place, and time. He appears well-developed and well-nourished.  HENT:  Head: Normocephalic and atraumatic.  Eyes: Pupils are equal, round, and reactive to light.  Neck: Normal  range of motion.  Cardiovascular: Normal rate.   Pulmonary/Chest: Effort normal.  Abdominal: Soft.  Musculoskeletal: Normal range of motion.  Neurological: He is alert and oriented to person, place, and time. He has normal reflexes.  Skin: Rash noted.  Redness full body,    Psychiatric: He has a normal mood and affect.    ED Course  Procedures (including critical care time) Labs Review Labs Reviewed - No data to display  Imaging Review No results found.   EKG Interpretation None      MDM   Final diagnoses:  Bee sting    Pt refused Iv medications including benadryl, solumedrol and pepcid.  Dr. Christy Gentles discussed with pt.   Pt agrees to epi prescription.   Pt advised to avoid bee stings.     [place], PA-C 03/21/14 1546

## 2014-03-21 NOTE — ED Notes (Signed)
Pt amb to Chief of Staff, states he is upset that he had to wait an hour to see a provider. Pt states "I guess no one works here because I haven't seen anyone since I got here!" reminded pt that he has seen a triage nurse, myself, a physician and a physician's assistant. Pt states "I am not going to wait around here for another hour or more for an iv. I'm not going to do it!" pt d/c home with wife.

## 2014-03-21 NOTE — ED Notes (Signed)
Dr. Christy Gentles speaking with pt and wife, pt refuses iv access, refuses medications, despite encouragement from [email or web address] and this rn. Pt states that he will accept an epi pen prescription and have it filled today. Alyse Low and this rn explain to pt and wife that allergic reactions tend to worsen, and that if this happens again he needs to use epi pen immediately and go to the nearest ER or call 911. Pt and wife verbalize understanding. 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 Pt and wife request d/c home.

## 2014-03-21 NOTE — ED Provider Notes (Signed)
Pt reports he was stung by bees and developed rash By the time of my evaluation he requested discharge as he felt improved and refused further treatment He did agree to take epipen Rx at home if he has any further reactions while at home  Sharyon Cable, MD 03/21/14 [phone]

## 2014-03-21 NOTE — ED Notes (Signed)
Bee stings 3 hours ago. C.o nausea. States he was mowing and hit the corner of a honey bee hive. No respiratory involvement.

## 2014-03-21 NOTE — ED Notes (Signed)
Pt refuses iv access or meds, states "I am going to be fine, we just want to go home." md alerted.

## 2014-03-21 NOTE — Discharge Instructions (Signed)
Bee, Wasp, or Hornet Sting °Your caregiver has diagnosed you as having an insect sting. An insect sting appears as a red lump in the skin that sometimes has a tiny hole in the center, or it may have a stinger in the center of the wound. The most common stings are from wasps, hornets and bees. °Individuals have different reactions to insect stings. °· A normal reaction may cause pain, swelling, and redness around the sting site. °· A localized allergic reaction may cause swelling and redness that extends beyond the sting site. °· A large local reaction may continue to develop over the next 12 to 36 hours. °· On occasion, the reactions can be severe (anaphylactic reaction). An anaphylactic reaction may cause wheezing; difficulty breathing; chest pain; fainting; raised, itchy, red patches on the skin; a sick feeling to your stomach (nausea); vomiting; cramping; or diarrhea. If you have had an anaphylactic reaction to an insect sting in the past, you are more likely to have one again. °HOME CARE INSTRUCTIONS  °· With bee stings, a small sac of poison is left in the wound. Brushing across this with something such as a credit card, or anything similar, will help remove this and decrease the amount of the reaction. This same procedure will not help a wasp sting as they do not leave behind a stinger and poison sac. °· Apply a cold compress for 10 to 20 minutes every hour for 1 to 2 days, depending on severity, to reduce swelling and itching. °· To lessen pain, a paste made of water and baking soda may be rubbed on the bite or sting and left on for 5 minutes. °· To relieve itching and swelling, you may use take medication or apply medicated creams or lotions as directed. °· Only take over-the-counter or prescription medicines for pain, discomfort, or fever as directed by your caregiver. °· Wash the sting site daily with soap and water. Apply antibiotic ointment on the sting site as directed. °· If you suffered a severe  reaction: °· If you did not require hospitalization, an adult will need to stay with you for 24 hours in case the symptoms return. °· You may need to wear a medical bracelet or necklace stating the allergy. °· You and your family need to learn when and how to use an anaphylaxis kit or epinephrine injection. °· If you have had a severe reaction before, always carry your anaphylaxis kit with you. °SEEK MEDICAL CARE IF:  °· None of the above helps within 2 to 3 days. °· The area becomes red, warm, tender, and swollen beyond the area of the bite or sting. °· You have an oral temperature above 102° F (38.9° C). °SEEK IMMEDIATE MEDICAL CARE IF:  °You have symptoms of an allergic reaction which are: °· Wheezing. °· Difficulty breathing. °· Chest pain. °· Lightheadedness or fainting. °· Itchy, raised, red patches on the skin. °· Nausea, vomiting, cramping or diarrhea. °ANY OF THESE SYMPTOMS MAY REPRESENT A SERIOUS PROBLEM THAT IS AN EMERGENCY. Do not wait to see if the symptoms will go away. Get medical help right away. Call your local emergency services (911 in [place] DO NOT drive yourself to the hospital. °MAKE SURE YOU:  °· Understand these instructions. °· Will watch your condition. °· Will get help right away if you are not doing well or get worse. °Document Released: 11/01/2005 Document Revised: 01/24/2012 Document Reviewed: 04/18/2010 °ExitCare® Patient Information ©2014 [employer]. ° °

## 2014-03-25 ENCOUNTER — Encounter: Payer: Self-pay | Admitting: Internal Medicine

## 2014-03-25 ENCOUNTER — Ambulatory Visit (INDEPENDENT_AMBULATORY_CARE_PROVIDER_SITE_OTHER): Payer: BC Managed Care – PPO | Admitting: Internal Medicine

## 2014-03-25 VITALS — BP 138/72 | HR 92 | Temp 98.8°F | Wt 229.8 lb

## 2014-03-25 DIAGNOSIS — T148 Other injury of unspecified body region: Secondary | ICD-10-CM

## 2014-03-25 DIAGNOSIS — W57XXXA Bitten or stung by nonvenomous insect and other nonvenomous arthropods, initial encounter: Secondary | ICD-10-CM

## 2014-03-25 NOTE — Progress Notes (Signed)
Pre visit review using our clinic review tool, if applicable. No additional management support is needed unless otherwise documented below in the visit note. 

## 2014-03-25 NOTE — ED Provider Notes (Signed)
Medical screening examination/treatment/procedure(s) were conducted as a shared visit with non-physician practitioner(s) and myself.  I personally evaluated the patient during the encounter.   EKG Interpretation None        Sharyon Cable, MD 03/25/14 [phone]

## 2014-03-25 NOTE — Patient Instructions (Signed)
Please come back tomorrow  for labs ---  Next visit with me by 05-2014, fasting for a physical

## 2014-03-25 NOTE — Progress Notes (Signed)
Subjective:    Patient ID: Kyle Hatfield, male    DOB: [birth year], 68 y.o.   MRN: [id number]  DOS:  03/25/2014 Type of  visit: Acute visit For the last 1.5 years, he had on and off tick  bites, usually acquired in [facility]. Last tick bite was early April 2015. He is concerned because noted a rash in his arms a couple weeks ago: located distal from the elbows, bilateral, lesions  ~ 4 mm, round, red and slightly scaly. No itching, pain, it is already fading away. Her life is concerned about Lyme disease and RMSF. Does not recall having any large or target-like rash   ROS Denies fever, chills, weight loss. No arthralgias or myalgias. No headaches.   Past Medical History  Diagnosis Date  . Hyperlipidemia   . Hypertension   . Diabetes mellitus      dx w/ an  A1c 6.0 2011    Past Surgical History  Procedure Laterality Date  . Anterior cervical decomp/discectomy fusion  09/19/2012    Procedure: ANTERIOR CERVICAL DECOMPRESSION/DISCECTOMY FUSION 2 LEVELS;  Surgeon: Erline Levine, MD;  Location: [facility];  Service: Neurosurgery;  Laterality: N/A;  Cervical Four-Five Cervical Five-Six Anterior cervical decompression/diskectomy/fusion    History   Social History  . Marital Status: Married    Spouse Name: N/A    Number of Children: N/A  . Years of Education: N/A   Occupational History  . retired    Social History Main Topics  . Smoking status: Former Research scientist (life sciences)  . Smokeless tobacco: Never Used     Comment: quit in the 80s  . Alcohol Use: Yes     Comment: rarely  . Drug Use: No  . Sexual Activity: Not on file   Other Topics Concern  . Not on file   Social History Narrative   Married, has no children of his own but he does have step kids   former [employer], Engineer, manufacturing, was deployed in [place]                 Medication List       This list is accurate as of: 03/25/14  5:08 PM.  Always use your most recent med list.               aspirin  81 MG tablet  Take 81 mg by mouth daily.     atenolol 50 MG tablet  Commonly known as:  TENORMIN  Take 1 tablet (50 mg total) by mouth daily. Office visit due now     EPINEPHrine 0.3 mg/0.3 mL Soaj injection  Commonly known as:  EPIPEN  Inject 0.3 mLs (0.3 mg total) into the muscle as needed.     fenofibrate 160 MG tablet  Take 1 tablet (160 mg total) by mouth daily.     hydrochlorothiazide 25 MG tablet  Commonly known as:  HYDRODIURIL  Take 1 tablet (25 mg total) by mouth daily.     losartan 100 MG tablet  Commonly known as:  COZAAR  Take 1 tablet (100 mg total) by mouth daily.     simvastatin 40 MG tablet  Commonly known as:  ZOCOR  Take 1 tablet (40 mg total) by mouth every evening.           Objective:   Physical Exam BP 138/72  Pulse 92  Temp(Src) 98.8 F (37.1 C) (Oral)  Wt 229 lb 12.8 oz (104.237 kg)  SpO2 96%  General -- alert, well-developed,  NAD.   Extremities-- no pretibial edema bilaterally;Hands and wrists without synovitis. He has very few 2 or 3 mm round, pink, slightly scaly lesions these are from the elbows. No blisters or other lesions. Neurologic--  alert & oriented X3. Speech normal, gait normal, strength normal in all extremities.  Psych-- Cognition and judgment appear intact. Cooperative with normal attention span and concentration. No anxious or depressed appearing.     Assessment & Plan:   Tick bites , Patient with history of tick bites, requested to be tested for Lyme disease and RMSF. He is essentially asymptomatic. No classic rash. Plan: Lyme serology (interpretation may be difficult given lack of symptoms ---> patient aware).

## 2014-03-26 ENCOUNTER — Other Ambulatory Visit (INDEPENDENT_AMBULATORY_CARE_PROVIDER_SITE_OTHER): Payer: BC Managed Care – PPO

## 2014-03-26 ENCOUNTER — Telehealth: Payer: Self-pay

## 2014-03-26 DIAGNOSIS — W57XXXA Bitten or stung by nonvenomous insect and other nonvenomous arthropods, initial encounter: Principal | ICD-10-CM

## 2014-03-26 DIAGNOSIS — T148 Other injury of unspecified body region: Secondary | ICD-10-CM

## 2014-03-26 NOTE — Telephone Encounter (Signed)
Orders entered by Dr Larose Kells

## 2014-03-28 LAB — LYME ABY, WSTRN BLT IGG & IGM W/BANDS
B BURGDORFERI IGG ABS (IB): NEGATIVE
B BURGDORFERI IGM ABS (IB): NEGATIVE
LYME DISEASE 18 KD IGG: NONREACTIVE
LYME DISEASE 23 KD IGM: NONREACTIVE
LYME DISEASE 28 KD IGG: NONREACTIVE
LYME DISEASE 30 KD IGG: NONREACTIVE
LYME DISEASE 58 KD IGG: NONREACTIVE
LYME DISEASE 93 KD IGG: NONREACTIVE
Lyme Disease 23 kD IgG: NONREACTIVE
Lyme Disease 39 kD IgG: NONREACTIVE
Lyme Disease 39 kD IgM: NONREACTIVE
Lyme Disease 41 kD IgG: NONREACTIVE
Lyme Disease 41 kD IgM: NONREACTIVE
Lyme Disease 45 kD IgG: NONREACTIVE
Lyme Disease 66 kD IgG: NONREACTIVE

## 2014-04-15 ENCOUNTER — Other Ambulatory Visit: Payer: Self-pay | Admitting: Internal Medicine

## 2014-06-08 ENCOUNTER — Other Ambulatory Visit: Payer: Self-pay | Admitting: Internal Medicine

## 2014-07-10 ENCOUNTER — Encounter: Payer: Self-pay | Admitting: Internal Medicine

## 2014-07-10 ENCOUNTER — Ambulatory Visit (INDEPENDENT_AMBULATORY_CARE_PROVIDER_SITE_OTHER): Payer: BC Managed Care – PPO | Admitting: Internal Medicine

## 2014-07-10 VITALS — BP 138/62 | HR 63 | Temp 98.7°F | Wt 215.5 lb

## 2014-07-10 DIAGNOSIS — E785 Hyperlipidemia, unspecified: Secondary | ICD-10-CM

## 2014-07-10 DIAGNOSIS — E119 Type 2 diabetes mellitus without complications: Secondary | ICD-10-CM

## 2014-07-10 DIAGNOSIS — I10 Essential (primary) hypertension: Secondary | ICD-10-CM

## 2014-07-10 LAB — LIPID PANEL
Cholesterol: 142 mg/dL (ref 0–200)
HDL: 37.7 mg/dL — AB (ref >39.00)
LDL Cholesterol: 79 mg/dL (ref 0–99)
NONHDL: 104.3
Total CHOL/HDL Ratio: 4
Triglycerides: 127 mg/dL (ref 0.0–149.0)
VLDL: 25.4 mg/dL (ref 0.0–40.0)

## 2014-07-10 LAB — BASIC METABOLIC PANEL
BUN: 16 mg/dL (ref 6–23)
CALCIUM: 9.2 mg/dL (ref 8.4–10.5)
CO2: 27 mEq/L (ref 19–32)
CREATININE: 1.2 mg/dL (ref 0.4–1.5)
Chloride: 101 mEq/L (ref 96–112)
GFR: 64.25 mL/min (ref >60.00)
GLUCOSE: 109 mg/dL — AB (ref 70–99)
Potassium: 4.4 mEq/L (ref 3.5–5.1)
SODIUM: 137 meq/L (ref 135–145)

## 2014-07-10 LAB — HEMOGLOBIN A1C: Hgb A1c MFr Bld: 6 % (ref 4.6–6.5)

## 2014-07-10 MED ORDER — ATENOLOL 50 MG PO TABS: 50.0000 mg | ORAL_TABLET | Freq: Every day | ORAL | Status: DC

## 2014-07-10 MED ORDER — ENALAPRIL MALEATE 20 MG PO TABS: ORAL_TABLET | ORAL | Status: DC

## 2014-07-10 MED ORDER — FENOFIBRATE 160 MG PO TABS: 160.0000 mg | ORAL_TABLET | Freq: Every day | ORAL | Status: DC

## 2014-07-10 MED ORDER — HYDROCHLOROTHIAZIDE 25 MG PO TABS: ORAL_TABLET | ORAL | Status: DC

## 2014-07-10 MED ORDER — LOSARTAN POTASSIUM 100 MG PO TABS: ORAL_TABLET | ORAL | Status: DC

## 2014-07-10 MED ORDER — SIMVASTATIN 40 MG PO TABS: 40.0000 mg | ORAL_TABLET | Freq: Every evening | ORAL | Status: DC

## 2014-07-10 NOTE — Assessment & Plan Note (Addendum)
On lifestyle control, check the A1c

## 2014-07-10 NOTE — Progress Notes (Signed)
Subjective:    Patient ID: Kyle Hatfield, male    DOB: Jul 09, 1946, 68 y.o.   MRN: [id number]  DOS:  07/10/2014 Type of visit - description: rov History: Diabetes, on no medications, diet remains very good, he is very active, back working as a Clinical biochemist . High cholesterol, good medication compliance, no reported side effects. Hypertension, taking the medications correctly, ambulatory BPs 130/ 65   ROS Denies chest pain or difficulty breathing No nausea, vomiting, diarrhea. Occasionally leg cramps, usually once or twice a month, usually eating mustard helps  Past Medical History  Diagnosis Date  . Hyperlipidemia   . Hypertension   . Diabetes mellitus      dx w/ an  A1c 6.0 2011    Past Surgical History  Procedure Laterality Date  . Anterior cervical decomp/discectomy fusion  09/19/2012    Procedure: ANTERIOR CERVICAL DECOMPRESSION/DISCECTOMY FUSION 2 LEVELS;  Surgeon: Erline Levine, MD;  Location: [facility];  Service: Neurosurgery;  Laterality: N/A;  Cervical Four-Five Cervical Five-Six Anterior cervical decompression/diskectomy/fusion    History   Social History  . Marital Status: Married    Spouse Name: N/A    Number of Children: N/A  . Years of Education: N/A   Occupational History  . retired    Social History Main Topics  . Smoking status: Former Research scientist (life sciences)  . Smokeless tobacco: Never Used     Comment: quit in the 80s  . Alcohol Use: Yes     Comment: rarely  . Drug Use: No  . Sexual Activity: Not on file   Other Topics Concern  . 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  Always use your most recent med list.               aspirin 81 MG tablet  Take 81 mg by mouth daily.     atenolol 50 MG tablet  Commonly known as:  TENORMIN  Take 1  tablet (50 mg total) by mouth daily. Office visit due now     enalapril 20 MG tablet  Commonly known as:  VASOTEC  Take 1 tablet mouth daily     EPINEPHrine 0.3 mg/0.3 mL Soaj injection  Commonly known as:  EPIPEN  Inject 0.3 mLs (0.3 mg total) into the muscle as needed.     fenofibrate 160 MG tablet  Take 1 tablet (160 mg total) by mouth daily.     hydrochlorothiazide 25 MG tablet  Commonly known as:  HYDRODIURIL  TAKE 1 BY MOUTH DAILY     losartan 100 MG tablet  Commonly known as:  COZAAR  TAKE 1 BY MOUTH DAILY     simvastatin 40 MG tablet  Commonly known as:  ZOCOR  Take 1 tablet (40 mg total) by mouth every evening.           Objective:   Physical Exam BP 138/62  Pulse 63  Temp(Src) 98.7 F (37.1 C) (Oral)  Wt 215 lb 8 oz (97.75 kg)  SpO2 99% General -- alert, well-developed, NAD.  HEENT-- Not pale.  Lungs -- normal respiratory effort, no intercostal retractions, no accessory muscle use, and normal breath sounds.  Heart--  normal rate, regular rhythm, no murmur.  Abdomen-- Not distended, good bowel sounds,soft, non-tender.  Extremities-- no pretibial edema bilaterally  Neurologic--  alert & oriented X3. Speech normal, gait appropriate for age, strength symmetric and appropriate for age.  Psych-- Cognition and judgment appear intact. Cooperative with normal attention span and concentration. No anxious or depressed appearing.     Assessment & Plan:

## 2014-07-10 NOTE — Assessment & Plan Note (Signed)
Seems to be under excellent control, check a BMP, continue with losartan, Tenormin and Vasotec

## 2014-07-10 NOTE — Patient Instructions (Signed)
Get your blood work before you leave   Next visit is for a physical exam in 4-5 months   No need to come back fasting Please make an appointment

## 2014-07-10 NOTE — Progress Notes (Signed)
Pre-visit discussion using our clinic review tool. No additional management support is needed unless otherwise documented below in the visit note.  

## 2014-07-10 NOTE — Assessment & Plan Note (Signed)
Good compliance withSimvastatin, check FLP

## 2014-12-18 ENCOUNTER — Encounter: Payer: Self-pay | Admitting: Internal Medicine

## 2014-12-18 ENCOUNTER — Ambulatory Visit (INDEPENDENT_AMBULATORY_CARE_PROVIDER_SITE_OTHER): Payer: 59 | Admitting: Internal Medicine

## 2014-12-18 VITALS — BP 136/78 | HR 64 | Temp 97.9°F | Ht 69.0 in | Wt 216.4 lb

## 2014-12-18 DIAGNOSIS — X58XXXA Exposure to other specified factors, initial encounter: Secondary | ICD-10-CM

## 2014-12-18 DIAGNOSIS — Z23 Encounter for immunization: Secondary | ICD-10-CM

## 2014-12-18 DIAGNOSIS — Z0000 Encounter for general adult medical examination without abnormal findings: Secondary | ICD-10-CM

## 2014-12-18 DIAGNOSIS — I10 Essential (primary) hypertension: Secondary | ICD-10-CM

## 2014-12-18 LAB — ALT: ALT: 22 U/L (ref 0–53)

## 2014-12-18 LAB — BASIC METABOLIC PANEL
BUN: 14 mg/dL (ref 6–23)
CO2: 29 mEq/L (ref 19–32)
Calcium: 9.2 mg/dL (ref 8.4–10.5)
Chloride: 103 mEq/L (ref 96–112)
Creatinine, Ser: 1.27 mg/dL (ref 0.40–1.50)
GFR: 60.1 mL/min (ref >60.00)
Glucose, Bld: 114 mg/dL — ABNORMAL HIGH (ref 70–99)
Potassium: 4.1 mEq/L (ref 3.5–5.1)
Sodium: 140 mEq/L (ref 135–145)

## 2014-12-18 LAB — AST: AST: 24 U/L (ref 0–37)

## 2014-12-18 LAB — TSH: TSH: 1.44 u[IU]/mL (ref 0.35–4.50)

## 2014-12-18 LAB — PSA: PSA: 2.99 ng/mL (ref 0.10–4.00)

## 2014-12-18 MED ORDER — FENOFIBRATE 160 MG PO TABS: 160.0000 mg | ORAL_TABLET | Freq: Every day | ORAL | Status: DC

## 2014-12-18 MED ORDER — LOSARTAN POTASSIUM 100 MG PO TABS: ORAL_TABLET | ORAL | Status: DC

## 2014-12-18 MED ORDER — SIMVASTATIN 40 MG PO TABS: 40.0000 mg | ORAL_TABLET | Freq: Every evening | ORAL | Status: DC

## 2014-12-18 MED ORDER — ATENOLOL 50 MG PO TABS: 50.0000 mg | ORAL_TABLET | Freq: Every day | ORAL | Status: DC

## 2014-12-18 MED ORDER — HYDROCHLOROTHIAZIDE 25 MG PO TABS: ORAL_TABLET | ORAL | Status: DC

## 2014-12-18 MED ORDER — EPINEPHRINE 0.3 MG/0.3ML IJ SOAJ: 0.3000 mg | INTRAMUSCULAR | Status: DC | PRN

## 2014-12-18 NOTE — Progress Notes (Signed)
Pre visit review using our clinic review tool, if applicable. No additional management support is needed unless otherwise documented below in the visit note. 

## 2014-12-18 NOTE — Assessment & Plan Note (Signed)
He was attacked by honeybees before, he does carry an EpiPen

## 2014-12-18 NOTE — Patient Instructions (Signed)
Get your blood work before you leave     Please come back to the office in 6 months  for a routine check up  Come back fasting   Schedule the visit at the front desk

## 2014-12-18 NOTE — Progress Notes (Signed)
Subjective:    Patient ID: Kyle Hatfield, male    DOB: 1945-12-22, 69 y.o.   MRN: [id number]  DOS:  12/18/2014 Type of visit - description : cpx Interval history: In general feeling well, he is working full-time as an Clinical biochemist. Med list  corrected, has not taken vasotec in a while   Review of Systems Denies fever, chills, weight loss No chest pain or difficulty breathing No nausea, vomiting, diarrhea or blood in the stools No cough or sputum production No visual changes or eye discharge No anxiety or depression No dysuria, gross hematuria difficulty urinating No headache or dizziness  Past Medical History  Diagnosis Date  . Hyperlipidemia   . Hypertension   . Diabetes mellitus      dx w/ an  A1c 6.0 2011    Past Surgical History  Procedure Laterality Date  . Anterior cervical decomp/discectomy fusion  09/19/2012    Procedure: ANTERIOR CERVICAL DECOMPRESSION/DISCECTOMY FUSION 2 LEVELS;  Surgeon: Erline Levine, MD;  Location: [facility];  Service: Neurosurgery;  Laterality: N/A;  Cervical Four-Five Cervical Five-Six Anterior cervical decompression/diskectomy/fusion    History   Social History  . Marital Status: Married    Spouse Name: N/A    Number of Children: 0  . Years of Education: N/A   Occupational History  . 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 Smoking status: Former Research scientist (life sciences)  . Smokeless tobacco: Never Used     Comment: quit in the 80s  . Alcohol Use: Yes     Comment: rarely  . Drug Use: No  . Sexual Activity: Not on file   Other Topics Concern  . Not on file   Social History Narrative   Married, has no children of his own but he does have step kids   former Corporate treasurer, Engineer, manufacturing, was deployed in [place]              Family History  Problem Relation Age of Onset  . Prostate cancer Father     at age 92  . Colon cancer Neg Hx   . Diabetes Neg Hx   . CAD Mother     Mother MI at age 37  . Stroke Neg Hx          Medication List       This list is accurate as of: 12/18/14  6:49 PM.  Always use your most recent med list.               aspirin 81 MG tablet  Take 81 mg by mouth daily.     atenolol 50 MG tablet  Commonly known as:  TENORMIN  Take 1 tablet (50 mg total) by mouth daily.     EPINEPHrine 0.3 mg/0.3 mL Soaj injection  Commonly known as:  EPI-PEN  Inject 0.3 mLs (0.3 mg total) into the muscle as needed.     fenofibrate 160 MG tablet  Take 1 tablet (160 mg total) by mouth daily.     hydrochlorothiazide 25 MG tablet  Commonly known as:  HYDRODIURIL  TAKE 1 TABLET BY MOUTH DAILY     losartan 100 MG tablet  Commonly known as:  COZAAR  TAKE 1 TABLET BY MOUTH DAILY     simvastatin 40 MG tablet  Commonly known as:  ZOCOR  Take 1 tablet (40 mg total) by mouth every evening.           Objective:   Physical Exam  Constitutional: He  is oriented to person, place, and time. He appears well-developed. No distress.  HENT:  Head: Normocephalic and atraumatic.  Eyes: Right eye exhibits no discharge. Left eye exhibits no discharge.  Neck: No thyromegaly present.  Normal carotid pulses  Cardiovascular:  RRR, no murmur, rub or gallop  Pulmonary/Chest: Effort normal. No respiratory distress.  CTA B  Abdominal: Soft. Bowel sounds are normal. He exhibits no distension and no mass. There is no tenderness. There is no rebound and no guarding.  Genitourinary:  DRE with no external abnormalities, rectum without mass, prostate slightly enlarged but not tender or nodular. Brown stools.  Musculoskeletal: Normal range of motion. He exhibits no edema or tenderness.  Neurological: He is alert and oriented to person, place, and time. No cranial nerve deficit. He exhibits normal muscle tone. Coordination normal.  Speech normal, gait unassisted and normal for age, motor strength appropriate for age   Skin: Skin is warm and dry. No pallor.  Psychiatric: He has a normal mood and affect. His  behavior is normal. Judgment and thought content normal.  Vitals reviewed.        Assessment & Plan:   Problem List Items Addressed This Visit    Essential hypertension - Primary    The patient is not taking Vasotec. Continue with losartan, HCTZ and Tenormin Ambulatory BPs in the 130s.      Relevant Medications   atenolol (TENORMIN) tablet   EPINEPHrine 0.3 mg/0.3 mL IJ SOAJ injection   fenofibrate tablet   hydrochlorothiazide tablet   losartan (COZAAR) tablet   simvastatin (ZOCOR) tablet   Annual physical exam    Td   2012 pneumonia shot 2014 prevnar today shingles shot discussed , declined   Colonoscopy Vs.iFOB reviewed,  again declined a colonoscopy, provided iFOB DRE with a slightly large prostate, check a PSA Diet and exercise discussed       Relevant Orders   Basic metabolic panel (Completed)   AST (Completed)   ALT (Completed)   PSA (Completed)   TSH (Completed)   Contact with honey bee as cause of accidental injury    He was attacked by honeybees before, he does carry an EpiPen       Other Visit Diagnoses    Need for prophylactic vaccination against Streptococcus pneumoniae (pneumococcus)        Relevant Orders    Pneumococcal conjugate vaccine 13-valent (Completed)

## 2014-12-18 NOTE — Assessment & Plan Note (Addendum)
Td   2012 pneumonia shot 2014 prevnar today shingles shot discussed , declined   Colonoscopy Vs.iFOB reviewed,  again declined a colonoscopy, provided iFOB DRE with a slightly large prostate, check a PSA Diet and exercise discussed

## 2014-12-18 NOTE — Assessment & Plan Note (Signed)
The patient is not taking Vasotec. Continue with losartan, HCTZ and Tenormin Ambulatory BPs in the 130s.

## 2014-12-19 ENCOUNTER — Other Ambulatory Visit: Payer: Self-pay

## 2014-12-19 MED ORDER — LOSARTAN POTASSIUM 100 MG PO TABS: ORAL_TABLET | ORAL | Status: DC

## 2014-12-19 MED ORDER — HYDROCHLOROTHIAZIDE 25 MG PO TABS: ORAL_TABLET | ORAL | Status: DC

## 2014-12-19 MED ORDER — ATENOLOL 50 MG PO TABS: 50.0000 mg | ORAL_TABLET | Freq: Every day | ORAL | Status: DC

## 2014-12-19 MED ORDER — FENOFIBRATE 160 MG PO TABS: 160.0000 mg | ORAL_TABLET | Freq: Every day | ORAL | Status: DC

## 2014-12-19 MED ORDER — EPINEPHRINE 0.3 MG/0.3ML IJ SOAJ: 0.3000 mg | INTRAMUSCULAR | Status: DC | PRN

## 2014-12-19 MED ORDER — SIMVASTATIN 40 MG PO TABS: 40.0000 mg | ORAL_TABLET | Freq: Every evening | ORAL | Status: DC

## 2014-12-31 ENCOUNTER — Other Ambulatory Visit (INDEPENDENT_AMBULATORY_CARE_PROVIDER_SITE_OTHER): Payer: 59

## 2014-12-31 DIAGNOSIS — Z0000 Encounter for general adult medical examination without abnormal findings: Secondary | ICD-10-CM

## 2014-12-31 LAB — FECAL OCCULT BLOOD, IMMUNOCHEMICAL: Fecal Occult Bld: NEGATIVE

## 2015-08-19 ENCOUNTER — Encounter: Payer: Self-pay | Admitting: Internal Medicine

## 2015-08-19 ENCOUNTER — Ambulatory Visit (INDEPENDENT_AMBULATORY_CARE_PROVIDER_SITE_OTHER): Payer: 59 | Admitting: Internal Medicine

## 2015-08-19 VITALS — BP 122/66 | HR 61 | Temp 97.8°F | Ht 69.0 in | Wt 217.2 lb

## 2015-08-19 DIAGNOSIS — Z23 Encounter for immunization: Secondary | ICD-10-CM

## 2015-08-19 DIAGNOSIS — I10 Essential (primary) hypertension: Secondary | ICD-10-CM | POA: Diagnosis not present

## 2015-08-19 DIAGNOSIS — Z09 Encounter for follow-up examination after completed treatment for conditions other than malignant neoplasm: Secondary | ICD-10-CM | POA: Insufficient documentation

## 2015-08-19 DIAGNOSIS — E119 Type 2 diabetes mellitus without complications: Secondary | ICD-10-CM

## 2015-08-19 LAB — BASIC METABOLIC PANEL
BUN: 11 mg/dL (ref 6–23)
CALCIUM: 8.9 mg/dL (ref 8.4–10.5)
CO2: 29 meq/L (ref 19–32)
Chloride: 100 mEq/L (ref 96–112)
Creatinine, Ser: 1.13 mg/dL (ref 0.40–1.50)
GFR: 68.63 mL/min (ref >60.00)
Glucose, Bld: 108 mg/dL — ABNORMAL HIGH (ref 70–99)
Potassium: 3.4 mEq/L — ABNORMAL LOW (ref 3.5–5.1)
Sodium: 140 mEq/L (ref 135–145)

## 2015-08-19 LAB — HM DIABETES FOOT EXAM: HM DIABETIC FOOT EXAM: NORMAL

## 2015-08-19 LAB — HEMOGLOBIN A1C: Hgb A1c MFr Bld: 5.6 % (ref 4.6–6.5)

## 2015-08-19 NOTE — Progress Notes (Signed)
Pre visit review using our clinic review tool, if applicable. No additional management support is needed unless otherwise documented below in the visit note. 

## 2015-08-19 NOTE — Progress Notes (Signed)
Subjective:    Patient ID: Kyle Hatfield, male    DOB: Jul 11, 1946, 69 y.o.   MRN: [id number]  DOS:  08/19/2015 Type of visit - description : ROV Interval history: Doing well, good med compliance except for fenofibrate--- held temporarily by the [place]. Has a hard time affording the epipen No ambulatory CBGs, BPs in the 120s, 130s   Review of Systems Denies chest pain or difficulty breathing. No nausea, vomiting, diarrhea. Reports decrease sensitivity (feet) since neck surgery  Past Medical History  Diagnosis Date  . Hyperlipidemia   . Hypertension   . Diabetes mellitus      dx w/ an  A1c 6.0 2011    Past Surgical History  Procedure Laterality Date  . Anterior cervical decomp/discectomy fusion  09/19/2012    Procedure: ANTERIOR CERVICAL DECOMPRESSION/DISCECTOMY FUSION 2 LEVELS;  Surgeon: Erline Levine, MD;  Location: [facility];  Service: Neurosurgery;  Laterality: N/A;  Cervical Four-Five Cervical Five-Six Anterior cervical decompression/diskectomy/fusion    Social History   Social History  . Marital Status: Married    Spouse Name: N/A  . Number of Children: 0  . Years of Education: N/A   Occupational History  . 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 Smoking status: Former Research scientist (life sciences)  . Smokeless tobacco: Never Used     Comment: quit in the 80s  . Alcohol Use: Yes     Comment: rarely  . Drug Use: No  . Sexual Activity: Not on file   Other Topics Concern  . Not on file   Social History Narrative   Married, has no children of his own but he does have step kids   former [employer], Engineer, manufacturing, was deployed in [place]                 Medication List       This list is accurate as of: 08/19/15  5:43 PM.  Always use your most recent med list.               aspirin 81 MG tablet  Take 81 mg by mouth daily.     atenolol 50 MG tablet  Commonly known as:  TENORMIN  Take 1 tablet (50 mg total) by mouth daily.     EPINEPHrine 0.3 mg/0.3 mL  Soaj injection  Commonly known as:  EPI-PEN  Inject 0.3 mLs (0.3 mg total) into the muscle as needed.     fenofibrate 160 MG tablet  Take 1 tablet (160 mg total) by mouth daily.     hydrochlorothiazide 25 MG tablet  Commonly known as:  HYDRODIURIL  TAKE 1 TABLET BY MOUTH DAILY     losartan 100 MG tablet  Commonly known as:  COZAAR  TAKE 1 TABLET BY MOUTH DAILY     simvastatin 40 MG tablet  Commonly known as:  ZOCOR  Take 1 tablet (40 mg total) by mouth every evening.           Objective:   Physical Exam BP 122/66 mmHg  Pulse 61  Temp(Src) 97.8 F (36.6 C) (Oral)  Ht 5\' 9"  (1.753 m)  Wt 217 lb 4 oz (98.544 kg)  BMI 32.07 kg/m2  SpO2 98% General:   Well developed, well nourished . NAD.  HEENT:  Normocephalic . Face symmetric, atraumatic Lungs:  CTA B Normal respiratory effort, no intercostal retractions, no accessory muscle use. Heart: RRR,  no murmur.  No pretibial edema bilaterally  Diabetic foot exam: No edema,  good pedal pulses, pinprick examination negative Neurologic:  alert & oriented X3.  Speech normal, gait appropriate for age and unassisted Psych--  Cognition and judgment appear intact.  Cooperative with normal attention span and concentration.  Behavior appropriate. No anxious or depressed appearing.      Assessment & Plan:   Assessment > DM DX 2011 Paresthesias, lower extremity. Since neck surgery, neuropathy? HTN Hyperlipidemia Bee sting allergies (epipen)  Plan: Diabetes: on diet control, check A1c, feet exam negative today. He does have some paresthesias of on the feet since neck surgery HTN: Well-controlled, check a BMP Bee sting allergies: Unable to afford a pain, avoidance strongly encouraged

## 2015-08-19 NOTE — Assessment & Plan Note (Signed)
Diabetes: on diet control, check A1c, feet exam negative today. He does have some paresthesias of on the feet since neck surgery HTN: Well-controlled, check a BMP Bee sting allergies: Unable to afford a pain, avoidance strongly encouraged

## 2015-08-19 NOTE — Patient Instructions (Signed)
Get your blood work before you leave     Next visit  for a complete physical exam by February 2017, fasting.     Please schedule an appointment at the front desk

## 2015-11-28 ENCOUNTER — Telehealth: Payer: Self-pay | Admitting: Internal Medicine

## 2015-11-28 MED ORDER — EPINEPHRINE 0.3 MG/0.3ML IJ SOAJ: 0.3000 mg | INTRAMUSCULAR | Status: AC | PRN

## 2015-11-28 NOTE — Telephone Encounter (Signed)
Rx faxed to [facility], Attn: Evangeline Gula, RN at [phone].

## 2015-11-28 NOTE — Telephone Encounter (Signed)
Caller name: Bayan Relation to pt: self  Call back number: [phone] Pharmacy: [facility]  Reason for call: Pt came in office requesting needing EPINEPHrine 0.3 mg/0.3 mL IJ SOAJ injection pen, but mentioned needed to have it sent to [facility])  by fax attention to [employer] RN [facility])  Fax # [phone]. Heather direct # is [phone] ext [phone]. Pt states that way [facility] can [facility] for [facility]. Please advise.

## 2015-11-28 NOTE — Telephone Encounter (Signed)
Received fax confirmation on 11/28/2015 at 1208.

## 2015-11-28 NOTE — Telephone Encounter (Signed)
Rx printed, awaiting MD signature.  

## 2015-12-05 ENCOUNTER — Other Ambulatory Visit: Payer: Self-pay | Admitting: Internal Medicine

## 2015-12-15 NOTE — Telephone Encounter (Signed)
Pt called back stating that the [facility] says that they havent received a Rx fax. Pt would like to know if it can be refaxed.

## 2015-12-15 NOTE — Telephone Encounter (Signed)
Pt called to follow up on RX below. Advised him confirmation 11/28/15 12:08pm. Pt will [facility] back to tell them.

## 2015-12-16 NOTE — Telephone Encounter (Signed)
Rx re-faxed to [facility] [place], RN attention at [phone].

## 2015-12-16 NOTE — Telephone Encounter (Signed)
Received fax confirmation on 12/16/2015 at 0858.

## 2016-01-08 ENCOUNTER — Other Ambulatory Visit: Payer: Self-pay | Admitting: Internal Medicine

## 2016-04-22 ENCOUNTER — Telehealth: Payer: Self-pay

## 2016-04-22 ENCOUNTER — Ambulatory Visit (INDEPENDENT_AMBULATORY_CARE_PROVIDER_SITE_OTHER): Payer: 59 | Admitting: Internal Medicine

## 2016-04-22 ENCOUNTER — Encounter: Payer: Self-pay | Admitting: Internal Medicine

## 2016-04-22 VITALS — BP 126/74 | HR 68 | Temp 97.8°F | Ht 69.0 in | Wt 213.5 lb

## 2016-04-22 DIAGNOSIS — Z0000 Encounter for general adult medical examination without abnormal findings: Secondary | ICD-10-CM | POA: Diagnosis not present

## 2016-04-22 LAB — CBC WITH DIFFERENTIAL/PLATELET
BASOS ABS: 0 10*3/uL (ref 0.0–0.1)
Basophils Relative: 0.5 % (ref 0.0–3.0)
EOS ABS: 0.1 10*3/uL (ref 0.0–0.7)
Eosinophils Relative: 1.7 % (ref 0.0–5.0)
HCT: 46.3 % (ref 39.0–52.0)
Hemoglobin: 15.5 g/dL (ref 13.0–17.0)
LYMPHS ABS: 1.4 10*3/uL (ref 0.7–4.0)
Lymphocytes Relative: 22.9 % (ref 12.0–46.0)
MCHC: 33.5 g/dL (ref 30.0–36.0)
MCV: 86.1 fl (ref 78.0–100.0)
MONO ABS: 0.8 10*3/uL (ref 0.1–1.0)
MONOS PCT: 13.5 % — AB (ref 3.0–12.0)
NEUTROS PCT: 61.4 % (ref 43.0–77.0)
Neutro Abs: 3.7 10*3/uL (ref 1.4–7.7)
PLATELETS: 242 10*3/uL (ref 150.0–400.0)
RBC: 5.37 Mil/uL (ref 4.22–5.81)
RDW: 13.1 % (ref 11.5–15.5)
WBC: 6.1 10*3/uL (ref 4.0–10.5)

## 2016-04-22 LAB — BASIC METABOLIC PANEL
BUN: 13 mg/dL (ref 6–23)
CHLORIDE: 100 meq/L (ref 96–112)
CO2: 30 mEq/L (ref 19–32)
CREATININE: 1.41 mg/dL (ref 0.40–1.50)
Calcium: 9.6 mg/dL (ref 8.4–10.5)
GFR: 53.05 mL/min — AB (ref >60.00)
Glucose, Bld: 111 mg/dL — ABNORMAL HIGH (ref 70–99)
Potassium: 3.9 mEq/L (ref 3.5–5.1)
Sodium: 138 mEq/L (ref 135–145)

## 2016-04-22 LAB — LIPID PANEL
CHOLESTEROL: 132 mg/dL (ref 0–200)
HDL: 35.2 mg/dL — AB (ref >39.00)
LDL Cholesterol: 70 mg/dL (ref 0–99)
NonHDL: 97.2
TRIGLYCERIDES: 134 mg/dL (ref 0.0–149.0)
Total CHOL/HDL Ratio: 4
VLDL: 26.8 mg/dL (ref 0.0–40.0)

## 2016-04-22 LAB — AST: AST: 24 U/L (ref 0–37)

## 2016-04-22 LAB — ALT: ALT: 20 U/L (ref 0–53)

## 2016-04-22 NOTE — Assessment & Plan Note (Addendum)
Td   2012; pneumonia shot 2014;  prevnar 2016 shingles shot discussed , declined   CCS, never had a Cscope, IFOB (-) 12-2014; 3 screening modalities discussed, elected cologuard   2016: DRE 2016: slt enlarged prostate, PSA wnl   Diet and exercise discussed

## 2016-04-22 NOTE — Progress Notes (Signed)
Subjective:    Patient ID: Kyle Hatfield, male    DOB: 04-10-1946, 70 y.o.   MRN: [id number]  DOS:  04/22/2016 Type of visit - description : cpx Interval history: Good med compliance, no major concerns    Review of Systems Constitutional: No fever. No chills. No unexplained wt changes. No unusual sweats  HEENT: No dental problems, no ear discharge, no facial swelling, no voice changes. No eye discharge, no eye  redness , no  intolerance to light   Respiratory: No wheezing , no  difficulty breathing. No cough , no mucus production  Cardiovascular: No CP, no leg swelling , no  Palpitations  GI: no nausea, no vomiting, no diarrhea , no  abdominal pain.  No blood in the stools. No dysphagia, no odynophagia    Endocrine: No polyphagia, no polyuria , no polydipsia  GU: No dysuria, gross hematuria, difficulty urinating. No urinary urgency, no frequency.  Musculoskeletal: No joint swellings or unusual aches or pains  Skin: No change in the color of the skin, palor , no  Rash  Allergic, immunologic: No environmental allergies , no  food allergies  Neurological: No dizziness no  syncope. No headaches. No diplopia, no slurred, no slurred speech, no motor deficits, no facial  Numbness  Hematological: No enlarged lymph nodes, no easy bruising , no unusual bleedings  Psychiatry: No suicidal ideas, no hallucinations, no beavior problems, no confusion.  No unusual/severe anxiety, no depression    Past Medical History  Diagnosis Date  . Hyperlipidemia   . Hypertension   . Diabetes mellitus      dx w/ an  A1c 6.0 2011    Past Surgical History  Procedure Laterality Date  . Anterior cervical decomp/discectomy fusion  09/19/2012    Procedure: ANTERIOR CERVICAL DECOMPRESSION/DISCECTOMY FUSION 2 LEVELS;  Surgeon: Erline Levine, MD;  Location: [place];  Service: Neurosurgery;  Laterality: N/A;  Cervical Four-Five Cervical Five-Six Anterior cervical decompression/diskectomy/fusion      Social History   Social History  . Marital Status: Married    Spouse Name: N/A  . Number of Children: 0  . Years of Education: N/A   Occupational History  . working part time Engineer, site    Social History Main Topics  . Smoking status: Former Research scientist (life sciences)  . Smokeless tobacco: Never Used     Comment: quit in the 80s  . Alcohol Use: Yes     Comment: rarely  . Drug Use: No  . Sexual Activity: Not on file   Other Topics Concern  . Not on file   Social History Narrative   Married, has no children of his own but he does have step kids   former Corporate treasurer, Engineer, manufacturing, was deployed in [place]              Family History  Problem Relation Age of Onset  . Prostate cancer Father     at age 54  . Colon cancer Neg Hx   . Diabetes Neg Hx   . CAD Mother     Mother MI at age 71  . Stroke Neg Hx        Medication List       This list is accurate as of: 04/22/16  4:21 PM.  Always use your most recent med list.               aspirin 81 MG tablet  Take 81 mg by mouth daily.     atenolol 50 MG tablet  Commonly known as:  TENORMIN  Take 1 tablet (50 mg total) by mouth daily.     EPINEPHrine 0.3 mg/0.3 mL Soaj injection  Commonly known as:  EPI-PEN  Inject 0.3 mLs (0.3 mg total) into the muscle as needed.     fenofibrate 160 MG tablet  Take 1 tablet (160 mg total) by mouth daily.     hydrochlorothiazide 25 MG tablet  Commonly known as:  HYDRODIURIL  Take 1 tablet (25 mg total) by mouth daily.     losartan 100 MG tablet  Commonly known as:  COZAAR  Take 1 tablet (100 mg total) by mouth daily.     simvastatin 40 MG tablet  Commonly known as:  ZOCOR  Take 1 tablet (40 mg total) by mouth daily.           Objective:   Physical Exam BP 126/74 mmHg  Pulse 68  Temp(Src) 97.8 F (36.6 C) (Oral)  Ht 5\' 9"  (1.753 m)  Wt 213 lb 8 oz (96.843 kg)  BMI 31.51 kg/m2  SpO2 98% General:   Well developed, well nourished . NAD.  Neck: No  thyromegaly  HEENT:   Normocephalic . Face symmetric, atraumatic Lungs:  CTA B Normal respiratory effort, no intercostal retractions, no accessory muscle use. Heart: RRR,  no murmur.  No pretibial edema bilaterally  Abdomen:  Not distended, soft, non-tender. No rebound or rigidity.   Skin: Exposed areas without rash. Not pale. Not jaundice Neurologic:  alert & oriented X3.  Speech normal, gait appropriate for age and unassisted Strength symmetric and appropriate for age.  Psych: Cognition and judgment appear intact.  Cooperative with normal attention span and concentration.  Behavior appropriate. No anxious or depressed appearing.     Assessment & Plan:   Assessment > DM DX 2011, ?of neuropathy, sx could be d/t neck surgery Paresthesias, lower extremity. Since neck surgery HTN Hyperlipidemia Bee sting allergies (epipen) HOH_ hearing aid at the [place]  PLAN: Diabetes: Last A1c satisfactory, diet and exercise discussed, check A1c on RTC HTN: Good med compliance, no ambulatory BPs, BP today is normal, no change. Hyperlipidemia: Checking labs today. Continue  simva RTC 8 months

## 2016-04-22 NOTE — Patient Instructions (Signed)
Get your blood work before you leave   Next visit 8 months   Please consider visit these websites for more information about a health care power of attorney : [email or web address]    Fall Prevention and Home Safety Falls cause injuries and can affect all age groups. It is possible to use preventive measures to significantly decrease the likelihood of falls. There are many simple measures which can make your home safer and prevent falls. OUTDOORS  Repair cracks and edges of walkways and driveways.  Remove high doorway thresholds.  Trim shrubbery on the main path into your home.  Have good outside lighting.  Clear walkways of tools, rocks, debris, and clutter.  Check that handrails are not broken and are securely fastened. Both sides of steps should have handrails.  Have leaves, snow, and ice cleared regularly.  Use sand or salt on walkways during winter months.  In the garage, clean up grease or oil spills. BATHROOM  Install night lights.  Install grab bars by the toilet and in the tub and shower.  Use non-skid mats or decals in the tub or shower.  Place a plastic non-slip stool in the shower to sit on, if needed.  Keep floors dry and clean up all water on the floor immediately.  Remove soap buildup in the tub or shower on a regular basis.  Secure bath mats with non-slip, double-sided rug tape.  Remove throw rugs and tripping hazards from the floors. BEDROOMS  Install night lights.  Make sure a bedside light is easy to reach.  Do not use oversized bedding.  Keep a telephone by your bedside.  Have a firm chair with side arms to use for getting dressed.  Remove throw rugs and tripping hazards from the floor. KITCHEN  Keep handles on pots and pans turned toward the center of the stove. Use back burners when possible.  Clean up spills quickly and allow time for drying.  Avoid walking on wet floors.  Avoid hot utensils  and knives.  Position shelves so they are not too high or low.  Place commonly used objects within easy reach.  If necessary, use a sturdy step stool with a grab bar when reaching.  Keep electrical cables out of the way.  Do not use floor polish or wax that makes floors slippery. If you must use wax, use non-skid floor wax.  Remove throw rugs and tripping hazards from the floor. STAIRWAYS  Never leave objects on stairs.  Place handrails on both sides of stairways and use them. Fix any loose handrails. Make sure handrails on both sides of the stairways are as long as the stairs.  Check carpeting to make sure it is firmly attached along stairs. Make repairs to worn or loose carpet promptly.  Avoid placing throw rugs at the top or bottom of stairways, or properly secure the rug with carpet tape to prevent slippage. Get rid of throw rugs, if possible.  Have an electrician put in a light switch at the top and bottom of the stairs. OTHER FALL PREVENTION TIPS  Wear low-heel or rubber-soled shoes that are supportive and fit well. Wear closed toe shoes.  When using a stepladder, make sure it is fully opened and both spreaders are firmly locked. Do not climb a closed stepladder.  Add color or contrast paint or tape to grab bars and handrails in your home. Place contrasting color strips on first and last steps.  Learn and use mobility aids as needed. Install an Dealer  emergency response system.  Turn on lights to avoid dark areas. Replace light bulbs that burn out immediately. Get light switches that glow.  Arrange furniture to create clear pathways. Keep furniture in the same place.  Firmly attach carpet with non-skid or double-sided tape.  Eliminate uneven floor surfaces.  Select a carpet pattern that does not visually hide the edge of steps.  Be aware of all pets. OTHER HOME SAFETY TIPS  Set the water temperature for 120 F (48.8 C).  Keep emergency numbers on or near  the telephone.  Keep smoke detectors on every level of the home and near sleeping areas. Document Released: 10/22/2002 Document Revised: 05/02/2012 Document Reviewed: 01/21/2012 [facility] Patient Information 2015 [place], [place]. This information is not intended to replace advice given to you by your health care provider. Make sure you discuss any questions you have with your health care provider.   Preventive Care for Adults Ages 50 and over  Blood pressure check.** / Every 1 to 2 years.  Lipid and cholesterol check.**/ Every 5 years beginning at age 47.  Lung cancer screening. / Every year if you are aged 53-80 years and have a 30-pack-year history of smoking and currently smoke or have quit within the past 15 years. Yearly screening is stopped once you have quit smoking for at least 15 years or develop a health problem that would prevent you from having lung cancer treatment.  Fecal occult blood test (FOBT) of stool. / Every year beginning at age 37 and continuing until age 71. You may not have to do this test if you get a colonoscopy every 10 years.  Flexible sigmoidoscopy** or colonoscopy.** / Every 5 years for a flexible sigmoidoscopy or every 10 years for a colonoscopy beginning at age 9 and continuing until age 61.  Hepatitis C blood test.** / For all people born from [birth year] through 1964 and any individual with known risks for hepatitis C.  Abdominal aortic aneurysm (AAA) screening.** / A one-time screening for ages 69 to 22 years who are current or former smokers.  Skin self-exam. / Monthly.  Influenza vaccine. / Every year.  Tetanus, diphtheria, and acellular pertussis (Tdap/Td) vaccine.** / 1 dose of Td every 10 years.  Varicella vaccine.** / Consult your health care provider.  Zoster vaccine.** / 1 dose for adults aged 46 years or older.  Pneumococcal 13-valent conjugate (PCV13) vaccine.** / Consult your health care provider.  Pneumococcal polysaccharide (PPSV23)  vaccine.** / 1 dose for all adults aged 18 years and older.  Meningococcal vaccine.** / Consult your health care provider.  Hepatitis A vaccine.** / Consult your health care provider.  Hepatitis B vaccine.** / Consult your health care provider.  Haemophilus influenzae type b (Hib) vaccine.** / Consult your health care provider. **Family history and personal history of risk and conditions may change your health care provider's recommendations. Document Released: 12/28/2001 Document Revised: 11/06/2013 Document Reviewed: 03/29/2011 [facility] Patient Information 2015 [place], [place]. This information is not intended to replace advice given to you by your health care provider. Make sure you discuss any questions you have with your health care provider.

## 2016-04-22 NOTE — Telephone Encounter (Signed)
Cologuard kit ordered via [employer] portal. Order confirmation: [id number]. Order confirmation sent for scanning.

## 2016-04-22 NOTE — Progress Notes (Signed)
Pre visit review using our clinic review tool, if applicable. No additional management support is needed unless otherwise documented below in the visit note. 

## 2016-04-22 NOTE — Assessment & Plan Note (Signed)
Diabetes: Last A1c satisfactory, diet and exercise discussed, check A1c on RTC HTN: Good med compliance, no ambulatory BPs, BP today is normal, no change. Hyperlipidemia: Checking labs today. Continue  simva RTC 8 months

## 2016-04-26 LAB — COLOGUARD: Cologuard: NEGATIVE

## 2016-05-14 NOTE — Telephone Encounter (Signed)
Received Cologuard results, placed in red folder for PCP review. Copy of report mailed to Pt.

## 2016-05-14 NOTE — Telephone Encounter (Signed)
Negative, notify pt

## 2016-05-14 NOTE — Telephone Encounter (Signed)
Result report sent for scanning.

## 2016-06-05 ENCOUNTER — Other Ambulatory Visit: Payer: Self-pay | Admitting: Internal Medicine

## 2016-07-12 ENCOUNTER — Telehealth: Payer: Self-pay | Admitting: Internal Medicine

## 2016-07-12 MED ORDER — ATENOLOL 50 MG PO TABS: 50.0000 mg | ORAL_TABLET | Freq: Every day | ORAL | 0 refills | Status: DC

## 2016-07-12 NOTE — Telephone Encounter (Signed)
Rx sent 

## 2016-07-12 NOTE — Telephone Encounter (Signed)
Spouse called in requesting a refill on Rx for atenolol. She says that Mail carrier states that the manufacture is out of medication until Sept 12. She would like to know if PCP could call in a Rx to [facility], production at [facility] .Marland Kitchen    Phone : [phone]

## 2016-07-13 ENCOUNTER — Telehealth: Payer: Self-pay

## 2016-07-13 MED ORDER — METOPROLOL TARTRATE 25 MG PO TABS: 25.0000 mg | ORAL_TABLET | Freq: Two times a day (BID) | ORAL | 1 refills | Status: DC

## 2016-07-13 MED ORDER — METOPROLOL TARTRATE 25 MG PO TABS: 25.0000 mg | ORAL_TABLET | Freq: Two times a day (BID) | ORAL | 0 refills | Status: DC

## 2016-07-13 NOTE — Telephone Encounter (Signed)
Advise patient: Unable to get atenolol, switch to metoprolol 25 mg one tablet twice a day #60 and 6 refills. He should be okay although needs to monitor for side effects; also monitor BP to be sure is well-controlled.

## 2016-07-13 NOTE — Telephone Encounter (Signed)
LMOM informing Pt of Atenolol- national manufacturer back order and unsure when it will be available. Instructed him to d/c Atenolol 50 mg and start Metoprolol 25mg  1 tab bid. 30 day supply sent to [facility] and 90 day supply and 1 refill sent to [facility]. Instructed Pt to call if questions/concerns and if BP is not well controlled.

## 2016-07-13 NOTE — Telephone Encounter (Signed)
Received fax from [facility]. Atenolol 50 mg tabs on back order. Requesting medication change or further advice.

## 2016-08-15 ENCOUNTER — Other Ambulatory Visit: Payer: Self-pay | Admitting: Internal Medicine

## 2016-11-04 ENCOUNTER — Other Ambulatory Visit: Payer: Self-pay | Admitting: Internal Medicine

## 2016-12-23 ENCOUNTER — Ambulatory Visit (INDEPENDENT_AMBULATORY_CARE_PROVIDER_SITE_OTHER): Payer: 59 | Admitting: Internal Medicine

## 2016-12-23 ENCOUNTER — Encounter: Payer: Self-pay | Admitting: Internal Medicine

## 2016-12-23 VITALS — BP 128/68 | HR 63 | Temp 97.7°F | Resp 12 | Ht 69.0 in | Wt 219.0 lb

## 2016-12-23 DIAGNOSIS — I10 Essential (primary) hypertension: Secondary | ICD-10-CM | POA: Diagnosis not present

## 2016-12-23 DIAGNOSIS — E119 Type 2 diabetes mellitus without complications: Secondary | ICD-10-CM

## 2016-12-23 DIAGNOSIS — Z1159 Encounter for screening for other viral diseases: Secondary | ICD-10-CM

## 2016-12-23 LAB — BASIC METABOLIC PANEL
BUN: 12 mg/dL (ref 6–23)
CO2: 29 mEq/L (ref 19–32)
Calcium: 9.3 mg/dL (ref 8.4–10.5)
Chloride: 101 mEq/L (ref 96–112)
Creatinine, Ser: 1.32 mg/dL (ref 0.40–1.50)
GFR: 57.13 mL/min — AB (ref >60.00)
GLUCOSE: 110 mg/dL — AB (ref 70–99)
Potassium: 3.6 mEq/L (ref 3.5–5.1)
SODIUM: 140 meq/L (ref 135–145)

## 2016-12-23 LAB — HEMOGLOBIN A1C: Hgb A1c MFr Bld: 5.8 % (ref 4.6–6.5)

## 2016-12-23 LAB — HEPATITIS C ANTIBODY: HCV Ab: NEGATIVE

## 2016-12-23 NOTE — Progress Notes (Signed)
Pre visit review using our clinic review tool, if applicable. No additional management support is needed unless otherwise documented below in the visit note. 

## 2016-12-23 NOTE — Progress Notes (Signed)
Subjective:    Patient ID: Kyle Hatfield, male    DOB: January 27, 1946, 71 y.o.   MRN: [id number]  DOS:  12/23/2016 Type of visit - description : rov Interval history: No major concerns, good compliance w/ medication. Diet remains healthy Ambulatory BPs usually in the 130, 120/80.    Review of Systems Lower extremity paresthesias at baseline Denies chest pain or difficulty breathing  Past Medical History:  Diagnosis Date  . Diabetes mellitus     dx w/ an  A1c 6.0 2011  . Hyperlipidemia   . Hypertension     Past Surgical History:  Procedure Laterality Date  . ANTERIOR CERVICAL DECOMP/DISCECTOMY FUSION  09/19/2012   Procedure: ANTERIOR CERVICAL DECOMPRESSION/DISCECTOMY FUSION 2 LEVELS;  Surgeon: Erline Levine, MD;  Location: [facility];  Service: Neurosurgery;  Laterality: N/A;  Cervical Four-Five Cervical Five-Six Anterior cervical decompression/diskectomy/fusion    Social History   Social History  . Marital status: Married    Spouse name: N/A  . Number of children: 0  . Years of education: N/A   Occupational History  . working part time -[place] Topics  . Smoking status: Former Research scientist (life sciences)  . Smokeless tobacco: Never Used     Comment: quit in the 80s  . Alcohol use Yes     Comment: rarely  . Drug use: No  . Sexual activity: Not on file   Other Topics Concern  . Not on file   Social History Narrative   Married, has no children of his own but he does have step kids   former [employer], Engineer, manufacturing, was deployed in [place]               Allergies as of 12/23/2016      Reactions   Strawberry Extract Itching   Bee Venom    Penicillins       Medication List       Accurate as of 12/23/16 11:59 PM. Always use your most recent med list.          aspirin 81 MG tablet Take 81 mg by mouth daily.   EPINEPHrine 0.3 mg/0.3 mL Soaj injection Commonly known as:  EPI-PEN Inject 0.3 mLs (0.3 mg total) into the muscle as  needed.   fenofibrate 160 MG tablet Take 1 tablet (160 mg total) by mouth daily.   hydrochlorothiazide 25 MG tablet Commonly known as:  HYDRODIURIL Take 1 tablet (25 mg total) by mouth daily.   losartan 100 MG tablet Commonly known as:  COZAAR Take 1 tablet (100 mg total) by mouth daily.   metoprolol tartrate 25 MG tablet Commonly known as:  LOPRESSOR Take 1 tablet (25 mg total) by mouth 2 (two) times daily.   simvastatin 40 MG tablet Commonly known as:  ZOCOR Take 1 tablet (40 mg total) by mouth daily.          Objective:   Physical Exam BP 128/68 (BP Location: Left Arm, Patient Position: Sitting, Cuff Size: Normal)   Pulse 63   Temp 97.7 F (36.5 C) (Oral)   Resp 12   Ht 5\' 9"  (1.753 m)   Wt 219 lb (99.3 kg)   SpO2 97%   BMI 32.34 kg/m  General:   Well developed, well nourished . NAD.  HEENT:  Normocephalic . Face symmetric, atraumatic Lungs:  CTA B Normal respiratory effort, no intercostal retractions, no accessory muscle use. Heart: RRR,  no murmur.  No pretibial edema bilaterally  DIABETIC  FEET EXAM: No lower extremity edema Skin normal, nails normal, no calluses Pinprick examination of the feet normal. Neurologic:  alert & oriented X3.  Speech normal, gait appropriate for age and unassisted Psych--  Cognition and judgment appear intact.  Cooperative with normal attention span and concentration.  Behavior appropriate. No anxious or depressed appearing.      Assessment & Plan:   Assessment   DM DX 2011, ?of neuropathy, sx could be d/t neck surgery Paresthesias, lower extremity. Since neck surgery HTN Hyperlipidemia Bee sting allergies (epipen) HOH_ hearing aid at the [place]  PLAN: DM: Has a healthy lifestyle, check A1c. Feet exam negative today HTN: Atenolol was changed to Lopressor, he is also on losartan, HCTZ. Good ambulatory BPs. Check a BMP Hyperlipidemia: Well-controlled on simvastatin and fenofibrate Check for hepatitis C. RTC 6 months  CPX

## 2016-12-23 NOTE — Patient Instructions (Signed)
GO TO THE LAB : Get the blood work     GO TO THE FRONT DESK Schedule your next appointment for a  Physical exam in 6 months   

## 2016-12-24 NOTE — Assessment & Plan Note (Signed)
DM: Has a healthy lifestyle, check A1c. Feet exam negative today HTN: Atenolol was changed to Lopressor, he is also on losartan, HCTZ. Good ambulatory BPs. Check a BMP Hyperlipidemia: Well-controlled on simvastatin and fenofibrate Check for hepatitis C. RTC 6 months CPX

## 2017-01-05 ENCOUNTER — Telehealth: Payer: Self-pay | Admitting: Internal Medicine

## 2017-01-05 MED ORDER — METOPROLOL TARTRATE 25 MG PO TABS: 25.0000 mg | ORAL_TABLET | Freq: Two times a day (BID) | ORAL | 2 refills | Status: DC

## 2017-01-05 MED ORDER — HYDROCHLOROTHIAZIDE 25 MG PO TABS: 25.0000 mg | ORAL_TABLET | Freq: Every day | ORAL | 2 refills | Status: DC

## 2017-01-05 NOTE — Telephone Encounter (Signed)
Rxs sent

## 2017-01-05 NOTE — Telephone Encounter (Signed)
Caller name: Relationship to patient: Self Can be reached: [phone] Pharmacy:  [place], [place] [phone] (Phone) [phone] (Fax)     Reason for call: Refill hydrochlorothiazide (HYDRODIURIL) 25 MG tablet   metoprolol tartrate (LOPRESSOR) 25 MG tablet

## 2017-01-07 ENCOUNTER — Other Ambulatory Visit: Payer: Self-pay | Admitting: Internal Medicine

## 2017-07-26 ENCOUNTER — Encounter: Payer: Self-pay | Admitting: Internal Medicine

## 2017-07-26 ENCOUNTER — Ambulatory Visit (INDEPENDENT_AMBULATORY_CARE_PROVIDER_SITE_OTHER): Payer: 59 | Admitting: Internal Medicine

## 2017-07-26 VITALS — BP 128/68 | HR 65 | Temp 98.0°F | Resp 14 | Ht 69.0 in | Wt 205.0 lb

## 2017-07-26 DIAGNOSIS — Z0000 Encounter for general adult medical examination without abnormal findings: Secondary | ICD-10-CM | POA: Diagnosis not present

## 2017-07-26 LAB — LIPID PANEL
CHOLESTEROL: 115 mg/dL (ref 0–200)
HDL: 39.8 mg/dL (ref >39.00)
LDL CALC: 56 mg/dL (ref 0–99)
NonHDL: 75.24
TRIGLYCERIDES: 98 mg/dL (ref 0.0–149.0)
Total CHOL/HDL Ratio: 3
VLDL: 19.6 mg/dL (ref 0.0–40.0)

## 2017-07-26 LAB — COMPREHENSIVE METABOLIC PANEL
ALBUMIN: 4.5 g/dL (ref 3.5–5.2)
ALT: 18 U/L (ref 0–53)
AST: 20 U/L (ref 0–37)
Alkaline Phosphatase: 38 U/L — ABNORMAL LOW (ref 39–117)
BUN: 13 mg/dL (ref 6–23)
CHLORIDE: 104 meq/L (ref 96–112)
CO2: 29 mEq/L (ref 19–32)
CREATININE: 1.21 mg/dL (ref 0.40–1.50)
Calcium: 9.6 mg/dL (ref 8.4–10.5)
GFR: 63.06 mL/min (ref >60.00)
Glucose, Bld: 113 mg/dL — ABNORMAL HIGH (ref 70–99)
Potassium: 4.7 mEq/L (ref 3.5–5.1)
SODIUM: 141 meq/L (ref 135–145)
TOTAL PROTEIN: 6.9 g/dL (ref 6.0–8.3)
Total Bilirubin: 0.5 mg/dL (ref 0.2–1.2)

## 2017-07-26 LAB — PSA: PSA: 3.27 ng/mL (ref 0.10–4.00)

## 2017-07-26 LAB — HEMOGLOBIN A1C: Hgb A1c MFr Bld: 5.6 % (ref 4.6–6.5)

## 2017-07-26 NOTE — Progress Notes (Signed)
Pre visit review using our clinic review tool, if applicable. No additional management support is needed unless otherwise documented below in the visit note. 

## 2017-07-26 NOTE — Patient Instructions (Signed)
GO TO THE LAB : Get the blood work     GO TO THE FRONT DESK Schedule your next appointment for a  Physical in one year    Check the  blood pressure 2 or 3 times a month   Be sure your blood pressure is between 110/65 and  145/85. If it is consistently higher or lower, let me know

## 2017-07-26 NOTE — Progress Notes (Signed)
Subjective:    Patient ID: Kyle Hatfield, male    DOB: 02-26-1946, 70 y.o.   MRN: [id number]  DOS:  07/26/2017 Type of visit - description : cpx Interval history: No major concerns, good medication compliance, ambulatory BPs in the 120/60.  Review of Systems  A 14 point review of systems is negative   Past Medical History:  Diagnosis Date  . Diabetes mellitus     dx w/ an  A1c 6.0 2011  . Hyperlipidemia   . Hypertension     Past Surgical History:  Procedure Laterality Date  . ANTERIOR CERVICAL DECOMP/DISCECTOMY FUSION  09/19/2012   Procedure: ANTERIOR CERVICAL DECOMPRESSION/DISCECTOMY FUSION 2 LEVELS;  Surgeon: Erline Levine, MD;  Location: [facility];  Service: Neurosurgery;  Laterality: N/A;  Cervical Four-Five Cervical Five-Six Anterior cervical decompression/diskectomy/fusion    Social History   Social History  . Marital status: Married    Spouse name: N/A  . Number of children: 0  . Years of education: N/A   Occupational History  . working part time -[employer] Topics  . Smoking status: Former Research scientist (life sciences)  . Smokeless tobacco: Never Used     Comment: quit in the 80s  . Alcohol use Yes     Comment: rarely  . Drug use: No  . Sexual activity: Not on file   Other Topics Concern  . Not on file   Social History Narrative   Married, has no children of his own but he does have step kids   former Corporate treasurer, Engineer, manufacturing, was deployed in [place]              Family History  Problem Relation Age of Onset  . Prostate cancer Father        at age 75  . CAD Mother        Mother MI at age 15  . Colon cancer Neg Hx   . Diabetes Neg Hx   . Stroke Neg Hx      Allergies as of 07/26/2017      Reactions   Strawberry Extract Itching   Bee Venom    Penicillins       Medication List       Accurate as of 07/26/17  4:06 PM. Always use your most recent med list.          aspirin 81 MG tablet Take 81 mg by mouth daily.     EPINEPHrine 0.3 mg/0.3 mL Soaj injection Commonly known as:  EPI-PEN Inject 0.3 mLs (0.3 mg total) into the muscle as needed.   fenofibrate 160 MG tablet Take 1 tablet (160 mg total) by mouth daily.   hydrochlorothiazide 25 MG tablet Commonly known as:  HYDRODIURIL Take 1 tablet (25 mg total) by mouth daily.   losartan 100 MG tablet Commonly known as:  COZAAR Take 1 tablet (100 mg total) by mouth daily.   metoprolol tartrate 25 MG tablet Commonly known as:  LOPRESSOR Take 1 tablet (25 mg total) by mouth 2 (two) times daily.   simvastatin 40 MG tablet Commonly known as:  ZOCOR Take 1 tablet (40 mg total) by mouth daily.            Discharge Care Instructions        Start     Ordered   07/26/17 0000  Comp Met (CMET)     07/26/17 0823   07/26/17 0000  Lipid panel     07/26/17 3354  07/26/17 0000  Hemoglobin A1c     07/26/17 0823   07/26/17 0000  PSA     07/26/17 0823         Objective:   Physical Exam BP 128/68 (BP Location: Left Arm, Patient Position: Sitting, Cuff Size: Normal)   Pulse 65   Temp 98 F (36.7 C) (Oral)   Resp 14   Ht _0  (1.753 m)   Wt 205 lb (93 kg)   SpO2 98%   BMI 30.27 kg/m   General:   Well developed, well nourished . NAD.  Neck: No  thyromegaly  HEENT:  Normocephalic . Face symmetric, atraumatic Lungs:  CTA B Normal respiratory effort, no intercostal retractions, no accessory muscle use. Heart: RRR,  no murmur.  No pretibial edema bilaterally  Abdomen:  Not distended, soft, non-tender. No rebound or rigidity.   Skin: Exposed areas without rash. Not pale. Not jaundice DIABETIC FEET EXAM: No lower extremity edema Normal pedal pulses bilaterally Skin normal, nails normal, no calluses Pinprick examination of the feet normal. Neurologic:  alert & oriented X3.  Speech normal, gait appropriate for age and unassisted Strength symmetric and appropriate for age.  Psych: Cognition and judgment appear intact.  Cooperative  with normal attention span and concentration.  Behavior appropriate. No anxious or depressed appearing.    Assessment & Plan:    Assessment   DM DX 2011, ?of neuropathy, sx could be d/t neck surgery Paresthesias, lower extremity. Since neck surgery HTN Hyperlipidemia Bee sting allergies (epipen) HOH_ hearing aid at the [place]  PLAN: DM: Diet control, has paresthesias with normal diabetic foot exam. Check A1c HTN: Continue Hyzaar, HCTZ and Lopressor. Checking labs Hyperlipidemia: On fenofibrate and simvastatin. Checking labs RTC one year, sooner if needed

## 2017-07-26 NOTE — Assessment & Plan Note (Addendum)
-   Td   2012; pneumonia shot 2014;  prevnar 2016; zostavax/shingrix discussed  - CCS,  (-) cologuard 04-2016 - 2016: DRE 2016: slt enlarged prostate, PSA wnl - Diet and exercise discussed. He remains active, works part time. - Labs: CMP, FLP, A1c, PSA

## 2017-07-26 NOTE — Assessment & Plan Note (Signed)
DM: Diet control, has paresthesias with normal diabetic foot exam. Check A1c HTN: Continue Hyzaar, HCTZ and Lopressor. Checking labs Hyperlipidemia: On fenofibrate and simvastatin. Checking labs RTC one year, sooner if needed

## 2017-07-27 ENCOUNTER — Other Ambulatory Visit: Payer: Self-pay | Admitting: Internal Medicine

## 2017-07-27 NOTE — Telephone Encounter (Signed)
Rxs sent

## 2017-07-27 NOTE — Telephone Encounter (Signed)
Okay refill both for one year

## 2017-07-27 NOTE — Telephone Encounter (Signed)
Pt requesting refill on fenofibrate 160mg  and simvastatin 40mg . Labs completed w/ physical yesterday. Okay to refill both?

## 2017-09-27 ENCOUNTER — Telehealth: Payer: Self-pay | Admitting: Internal Medicine

## 2017-09-27 MED ORDER — LOSARTAN POTASSIUM 100 MG PO TABS: 100.0000 mg | ORAL_TABLET | Freq: Every day | ORAL | 3 refills | Status: DC

## 2017-09-27 MED ORDER — METOPROLOL TARTRATE 25 MG PO TABS: 25.0000 mg | ORAL_TABLET | Freq: Two times a day (BID) | ORAL | 3 refills | Status: DC

## 2017-09-27 MED ORDER — FENOFIBRATE 160 MG PO TABS: 160.0000 mg | ORAL_TABLET | Freq: Every day | ORAL | 3 refills | Status: DC

## 2017-09-27 MED ORDER — HYDROCHLOROTHIAZIDE 25 MG PO TABS: 25.0000 mg | ORAL_TABLET | Freq: Every day | ORAL | 3 refills | Status: DC

## 2017-09-27 MED ORDER — SIMVASTATIN 40 MG PO TABS: 40.0000 mg | ORAL_TABLET | Freq: Every day | ORAL | 3 refills | Status: DC

## 2017-09-27 NOTE — Telephone Encounter (Signed)
Relation to KM:QKMM Call back number:[phone]   Reason for call:  Patient states starting January 1st, 2019 [facility] will longer cover medications and would like All medications to go to [place], there requesting office notes / RX please fax to, [facility] fax # [phone].

## 2017-09-27 NOTE — Telephone Encounter (Signed)
Rx's and notes faxed to the [facility] at number provided.

## 2017-09-27 NOTE — Telephone Encounter (Signed)
Received fax confirmation

## 2017-10-29 ENCOUNTER — Other Ambulatory Visit: Payer: Self-pay | Admitting: Internal Medicine

## 2017-11-20 ENCOUNTER — Other Ambulatory Visit: Payer: Self-pay | Admitting: Internal Medicine

## 2017-12-12 ENCOUNTER — Other Ambulatory Visit: Payer: Self-pay

## 2017-12-12 MED ORDER — HYDROCHLOROTHIAZIDE 25 MG PO TABS: 25.0000 mg | ORAL_TABLET | Freq: Every day | ORAL | 3 refills | Status: DC

## 2017-12-12 MED ORDER — LOSARTAN POTASSIUM 100 MG PO TABS: 100.0000 mg | ORAL_TABLET | Freq: Every day | ORAL | 3 refills | Status: DC

## 2017-12-12 MED ORDER — METOPROLOL TARTRATE 25 MG PO TABS: 25.0000 mg | ORAL_TABLET | Freq: Two times a day (BID) | ORAL | 0 refills | Status: DC

## 2017-12-12 MED ORDER — METOPROLOL TARTRATE 25 MG PO TABS: 25.0000 mg | ORAL_TABLET | Freq: Two times a day (BID) | ORAL | 3 refills | Status: DC

## 2017-12-12 MED ORDER — FENOFIBRATE 160 MG PO TABS: 160.0000 mg | ORAL_TABLET | Freq: Every day | ORAL | 3 refills | Status: DC

## 2017-12-12 MED ORDER — SIMVASTATIN 40 MG PO TABS: 40.0000 mg | ORAL_TABLET | Freq: Every day | ORAL | 3 refills | Status: DC

## 2018-01-07 ENCOUNTER — Other Ambulatory Visit: Payer: Self-pay | Admitting: Internal Medicine

## 2018-08-01 ENCOUNTER — Encounter: Payer: Self-pay | Admitting: Internal Medicine

## 2018-08-01 ENCOUNTER — Ambulatory Visit (INDEPENDENT_AMBULATORY_CARE_PROVIDER_SITE_OTHER): Payer: 59 | Admitting: Internal Medicine

## 2018-08-01 VITALS — BP 126/78 | HR 78 | Temp 98.2°F | Resp 16 | Ht 69.0 in | Wt 213.1 lb

## 2018-08-01 DIAGNOSIS — Z0000 Encounter for general adult medical examination without abnormal findings: Secondary | ICD-10-CM

## 2018-08-01 LAB — LIPID PANEL
CHOLESTEROL: 109 mg/dL (ref 0–200)
HDL: 33.2 mg/dL — AB (ref >39.00)
LDL CALC: 37 mg/dL (ref 0–99)
NonHDL: 75.86
Total CHOL/HDL Ratio: 3
Triglycerides: 192 mg/dL — ABNORMAL HIGH (ref 0.0–149.0)
VLDL: 38.4 mg/dL (ref 0.0–40.0)

## 2018-08-01 LAB — CBC WITH DIFFERENTIAL/PLATELET
Basophils Absolute: 0 10*3/uL (ref 0.0–0.1)
Basophils Relative: 0.7 % (ref 0.0–3.0)
EOS ABS: 0.1 10*3/uL (ref 0.0–0.7)
Eosinophils Relative: 1.6 % (ref 0.0–5.0)
HCT: 46.3 % (ref 39.0–52.0)
Hemoglobin: 15.6 g/dL (ref 13.0–17.0)
LYMPHS ABS: 1.2 10*3/uL (ref 0.7–4.0)
Lymphocytes Relative: 21.1 % (ref 12.0–46.0)
MCHC: 33.7 g/dL (ref 30.0–36.0)
MCV: 85.1 fl (ref 78.0–100.0)
MONO ABS: 0.7 10*3/uL (ref 0.1–1.0)
Monocytes Relative: 12.7 % — ABNORMAL HIGH (ref 3.0–12.0)
NEUTROS PCT: 63.9 % (ref 43.0–77.0)
Neutro Abs: 3.7 10*3/uL (ref 1.4–7.7)
PLATELETS: 208 10*3/uL (ref 150.0–400.0)
RBC: 5.44 Mil/uL (ref 4.22–5.81)
RDW: 12.9 % (ref 11.5–15.5)
WBC: 5.7 10*3/uL (ref 4.0–10.5)

## 2018-08-01 LAB — COMPREHENSIVE METABOLIC PANEL
ALBUMIN: 4.4 g/dL (ref 3.5–5.2)
ALT: 18 U/L (ref 0–53)
AST: 19 U/L (ref 0–37)
Alkaline Phosphatase: 61 U/L (ref 39–117)
BUN: 9 mg/dL (ref 6–23)
CHLORIDE: 98 meq/L (ref 96–112)
CO2: 27 mEq/L (ref 19–32)
Calcium: 9.3 mg/dL (ref 8.4–10.5)
Creatinine, Ser: 1.15 mg/dL (ref 0.40–1.50)
GFR: 66.67 mL/min (ref >60.00)
GLUCOSE: 111 mg/dL — AB (ref 70–99)
POTASSIUM: 3.7 meq/L (ref 3.5–5.1)
Sodium: 139 mEq/L (ref 135–145)
TOTAL PROTEIN: 7 g/dL (ref 6.0–8.3)
Total Bilirubin: 0.6 mg/dL (ref 0.2–1.2)

## 2018-08-01 LAB — PSA: PSA: 3.37 ng/mL (ref 0.10–4.00)

## 2018-08-01 LAB — HEMOGLOBIN A1C: HEMOGLOBIN A1C: 5.9 % (ref 4.6–6.5)

## 2018-08-01 MED ORDER — ATENOLOL 50 MG PO TABS: 50.0000 mg | ORAL_TABLET | Freq: Every day | ORAL | 1 refills | Status: DC

## 2018-08-01 MED ORDER — HYDROCHLOROTHIAZIDE 25 MG PO TABS: 25.0000 mg | ORAL_TABLET | Freq: Every day | ORAL | 1 refills | Status: AC

## 2018-08-01 MED ORDER — SIMVASTATIN 40 MG PO TABS: 40.0000 mg | ORAL_TABLET | Freq: Every day | ORAL | 1 refills | Status: AC

## 2018-08-01 MED ORDER — ATENOLOL 25 MG PO TABS: 25.0000 mg | ORAL_TABLET | Freq: Every day | ORAL | 1 refills | Status: AC

## 2018-08-01 MED ORDER — LOSARTAN POTASSIUM 100 MG PO TABS: 100.0000 mg | ORAL_TABLET | Freq: Every day | ORAL | 1 refills | Status: AC

## 2018-08-01 NOTE — Progress Notes (Signed)
Subjective:    Patient ID: Kyle Hatfield, male    DOB: 08-Aug-1946, 72 y.o.   MRN: [id number]  DOS:  08/01/2018 Type of visit - description : cpx Interval history: Doing well, no major concerns. Recently had a physical at the [place], some of his medications were changed.   Review of Systems History of paresthesias after neck surgery, they are improving/resolved.  Other than above, a 14 point review of systems is negative   Past Medical History:  Diagnosis Date  . Diabetes mellitus     dx w/ an  A1c 6.0 2011  . Hyperlipidemia   . Hypertension     Past Surgical History:  Procedure Laterality Date  . ANTERIOR CERVICAL DECOMP/DISCECTOMY FUSION  09/19/2012   Procedure: ANTERIOR CERVICAL DECOMPRESSION/DISCECTOMY FUSION 2 LEVELS;  Surgeon: Erline Levine, MD;  Location: [place];  Service: Neurosurgery;  Laterality: N/A;  Cervical Four-Five Cervical Five-Six Anterior cervical decompression/diskectomy/fusion    Social History   Socioeconomic History  . Marital status: Married    Spouse name: Not on file  . Number of children: 0  . Years of education: Not on file  . Highest education level: Not on file  Occupational History  . Occupation: working part time -Programmer, systems: [employer]  . Financial resource strain: Not on file  . Food insecurity:    Worry: Not on file    Inability: Not on file  . Transportation needs:    Medical: Not on file    Non-medical: Not on file  Tobacco Use  . Smoking status: Former Research scientist (life sciences)  . Smokeless tobacco: Never Used  . Tobacco comment: quit in the 80s  Substance and Sexual Activity  . Alcohol use: Yes    Comment: rarely  . Drug use: No  . Sexual activity: Not on file  Lifestyle  . Physical activity:    Days per week: Not on file    Minutes per session: Not on file  . Stress: Not on file  Relationships  . Social connections:    Talks on phone: Not on file    Gets together: Not on file    Attends  religious service: Not on file    Active member of club or organization: Not on file    Attends meetings of clubs or organizations: Not on file    Relationship status: Not on file  . Intimate partner violence:    Fear of current or ex partner: Not on file    Emotionally abused: Not on file    Physically abused: Not on file    Forced sexual activity: Not on file  Other Topics Concern  . Not on file  Social History Narrative   Married, has no children of his own but he does have step kids   former Corporate treasurer, Engineer, manufacturing, was deployed in [place]           Family History  Problem Relation Age of Onset  . Prostate cancer Father        at age 64  . CAD Mother        Mother MI at age 75  . Colon cancer Neg Hx   . Diabetes Neg Hx   . Stroke Neg Hx      Allergies as of 08/01/2018      Reactions   Strawberry Extract Itching   Bee Venom    Penicillins       Medication List  Accurate as of 08/01/18 11:59 PM. Always use your most recent med list.          aspirin 81 MG tablet Take 81 mg by mouth daily.   atenolol 25 MG tablet Commonly known as:  TENORMIN Take 1 tablet (25 mg total) by mouth daily.   atenolol 50 MG tablet Commonly known as:  TENORMIN Take 1 tablet (50 mg total) by mouth daily.   EPINEPHrine 0.3 mg/0.3 mL Soaj injection Commonly known as:  EPI-PEN Inject 0.3 mLs (0.3 mg total) into the muscle as needed.   hydrochlorothiazide 25 MG tablet Commonly known as:  HYDRODIURIL Take 1 tablet (25 mg total) by mouth daily.   losartan 100 MG tablet Commonly known as:  COZAAR Take 1 tablet (100 mg total) by mouth daily.   simvastatin 40 MG tablet Commonly known as:  ZOCOR Take 1 tablet (40 mg total) by mouth daily.          Objective:   Physical Exam BP 126/78 (BP Location: Left Arm, Patient Position: Sitting, Cuff Size: Small)   Pulse 78   Temp 98.2 F (36.8 C) (Oral)   Resp 16   Ht 5\' 9"  (1.753 m)   Wt 213 lb 2 oz (96.7 kg)   BMI 31.47 kg/m    General: Well developed, NAD, see BMI.  Neck: No  thyromegaly  HEENT:  Normocephalic . Face symmetric, atraumatic Lungs:  CTA B Normal respiratory effort, no intercostal retractions, no accessory muscle use. Heart: RRR,  no murmur.  No pretibial edema bilaterally  Abdomen:  Not distended, soft, non-tender. No rebound or rigidity.   Skin: Exposed areas without rash. Not pale. Not jaundice Rectal: External abnormalities: none. Normal sphincter tone. No rectal masses or tenderness.  Brown stools Prostate: Prostate gland firm and smooth, no enlargement, nodularity, tenderness, mass, asymmetry or induration  Neurologic:  alert & oriented X3.  Speech normal, gait appropriate for age and unassisted Strength symmetric and appropriate for age.  Psych: Cognition and judgment appear intact.  Cooperative with normal attention span and concentration.  Behavior appropriate. No anxious or depressed appearing.     Assessment & Plan:   Assessment   DM DX 2011, ?of neuropathy, sx could be d/t neck surgery Paresthesias, lower extremity. Since neck surgery HTN Hyperlipidemia Bee sting allergies (epipen) HOH_ hearing aid at the [place]  PLAN: Hyperglycemia: Diet controlled, check a A1c. HTN: Currently on losartan and beta-blockers.  Metoprolol was switched to atenolol 75 mg daily at the [place].  Good BP control, no side effects High cholesterol: On simvastatin, fenofibrate was recently stopped by the [place].  Checking labs. Rec to come back in 6 months for a FLP, like to see how his panel looks without fenofibrate. RTC 1 year

## 2018-08-01 NOTE — Patient Instructions (Signed)
GO TO THE LAB : Get the blood work     GO TO THE FRONT DESK Schedule your next appointment for a physical exam in 1 year.  Also, please come back in 6 months, fasting for blood work only.    Check the  blood pressure   monthly   Be sure your blood pressure is between 110/65 and  135/85. If it is consistently higher or lower, let me know

## 2018-08-01 NOTE — Assessment & Plan Note (Signed)
-   Td   2012; pneumonia shot 2014;  prevnar 2016; zostavax/shingrix discussed , declined  - CCS,  (-) cologuard 04-2016, reports a   stools test @ the [place] ~ 9- 2019 (apparently a IFOB) -Prostate cancer screening: DRE today show a slightly enlarged gland, otherwise normal.  Check a PSA - Diet and exercise discussed - Labs: CMP FLP CBC A1c PSA

## 2018-08-01 NOTE — Progress Notes (Signed)
Pre visit review using our clinic review tool, if applicable. No additional management support is needed unless otherwise documented below in the visit note. 

## 2018-08-02 NOTE — Assessment & Plan Note (Signed)
Hyperglycemia: Diet controlled, check a A1c. HTN: Currently on losartan and beta-blockers.  Metoprolol was switched to atenolol 75 mg daily at the [place].  Good BP control, no side effects High cholesterol: On simvastatin, fenofibrate was recently stopped by the [place].  Checking labs. Rec to come back in 6 months for a FLP, like to see how his panel looks without fenofibrate. RTC 1 year

## 2019-01-30 ENCOUNTER — Other Ambulatory Visit (INDEPENDENT_AMBULATORY_CARE_PROVIDER_SITE_OTHER): Payer: 59

## 2019-01-30 ENCOUNTER — Other Ambulatory Visit: Payer: Self-pay

## 2019-01-30 DIAGNOSIS — Z0000 Encounter for general adult medical examination without abnormal findings: Secondary | ICD-10-CM | POA: Diagnosis not present

## 2019-01-30 LAB — LIPID PANEL
Cholesterol: 129 mg/dL (ref 0–200)
HDL: 35.9 mg/dL — ABNORMAL LOW (ref >39.00)
LDL CALC: 61 mg/dL (ref 0–99)
NonHDL: 93.06
Total CHOL/HDL Ratio: 4
Triglycerides: 159 mg/dL — ABNORMAL HIGH (ref 0.0–149.0)
VLDL: 31.8 mg/dL (ref 0.0–40.0)

## 2019-08-01 ENCOUNTER — Other Ambulatory Visit: Payer: Self-pay

## 2019-08-03 ENCOUNTER — Other Ambulatory Visit: Payer: Self-pay

## 2019-08-03 ENCOUNTER — Ambulatory Visit (INDEPENDENT_AMBULATORY_CARE_PROVIDER_SITE_OTHER): Payer: 59 | Admitting: Internal Medicine

## 2019-08-03 ENCOUNTER — Encounter: Payer: Self-pay | Admitting: Internal Medicine

## 2019-08-03 VITALS — BP 136/72 | HR 70 | Temp 96.9°F | Resp 16 | Ht 69.0 in | Wt 214.0 lb

## 2019-08-03 DIAGNOSIS — R739 Hyperglycemia, unspecified: Secondary | ICD-10-CM

## 2019-08-03 DIAGNOSIS — Z0000 Encounter for general adult medical examination without abnormal findings: Secondary | ICD-10-CM

## 2019-08-03 DIAGNOSIS — I10 Essential (primary) hypertension: Secondary | ICD-10-CM

## 2019-08-03 DIAGNOSIS — Z23 Encounter for immunization: Secondary | ICD-10-CM | POA: Diagnosis not present

## 2019-08-03 LAB — CBC WITH DIFFERENTIAL/PLATELET
Basophils Absolute: 0 10*3/uL (ref 0.0–0.1)
Basophils Relative: 0.3 % (ref 0.0–3.0)
Eosinophils Absolute: 0.1 10*3/uL (ref 0.0–0.7)
Eosinophils Relative: 2 % (ref 0.0–5.0)
HCT: 45.8 % (ref 39.0–52.0)
Hemoglobin: 15.3 g/dL (ref 13.0–17.0)
Lymphocytes Relative: 21.2 % (ref 12.0–46.0)
Lymphs Abs: 1.3 10*3/uL (ref 0.7–4.0)
MCHC: 33.3 g/dL (ref 30.0–36.0)
MCV: 86.9 fl (ref 78.0–100.0)
Monocytes Absolute: 0.7 10*3/uL (ref 0.1–1.0)
Monocytes Relative: 12.2 % — ABNORMAL HIGH (ref 3.0–12.0)
Neutro Abs: 3.8 10*3/uL (ref 1.4–7.7)
Neutrophils Relative %: 64.3 % (ref 43.0–77.0)
Platelets: 194 10*3/uL (ref 150.0–400.0)
RBC: 5.27 Mil/uL (ref 4.22–5.81)
RDW: 13.3 % (ref 11.5–15.5)
WBC: 5.9 10*3/uL (ref 4.0–10.5)

## 2019-08-03 LAB — COMPREHENSIVE METABOLIC PANEL
ALT: 21 U/L (ref 0–53)
AST: 20 U/L (ref 0–37)
Albumin: 4.3 g/dL (ref 3.5–5.2)
Alkaline Phosphatase: 52 U/L (ref 39–117)
BUN: 14 mg/dL (ref 6–23)
CO2: 31 mEq/L (ref 19–32)
Calcium: 9.2 mg/dL (ref 8.4–10.5)
Chloride: 100 mEq/L (ref 96–112)
Creatinine, Ser: 1.15 mg/dL (ref 0.40–1.50)
GFR: 62.55 mL/min (ref >60.00)
Glucose, Bld: 119 mg/dL — ABNORMAL HIGH (ref 70–99)
Potassium: 3.9 mEq/L (ref 3.5–5.1)
Sodium: 139 mEq/L (ref 135–145)
Total Bilirubin: 0.6 mg/dL (ref 0.2–1.2)
Total Protein: 7 g/dL (ref 6.0–8.3)

## 2019-08-03 LAB — LIPID PANEL
Cholesterol: 122 mg/dL (ref 0–200)
HDL: 33.6 mg/dL — ABNORMAL LOW (ref >39.00)
LDL Cholesterol: 57 mg/dL (ref 0–99)
NonHDL: 88.55
Total CHOL/HDL Ratio: 4
Triglycerides: 160 mg/dL — ABNORMAL HIGH (ref 0.0–149.0)
VLDL: 32 mg/dL (ref 0.0–40.0)

## 2019-08-03 LAB — TSH: TSH: 1.51 u[IU]/mL (ref 0.35–4.50)

## 2019-08-03 LAB — HEMOGLOBIN A1C: Hgb A1c MFr Bld: 5.9 % (ref 4.6–6.5)

## 2019-08-03 NOTE — Patient Instructions (Addendum)
Per our records you are due for an eye exam. Please contact your eye doctor to schedule an appointment. Please have them send copies of your office visit notes to [place]. Our fax number is [phone].  GO TO THE LAB : Get the blood work     GO TO THE FRONT DESK Schedule your next appointment   for a physical exam in 1 year  Check the  blood pressure once a month  BP GOAL is between 110/65 and  135/85. If it is consistently higher or lower, let me know

## 2019-08-03 NOTE — Progress Notes (Signed)
Subjective:    Patient ID: Kyle Hatfield, male    DOB: [birth year], 73 y.o.   MRN: [id number]  DOS:  08/03/2019 Type of visit - description: CPX No major concerns. He remains active, trying to eat healthy.  Review of Systems  A 14 point review of systems is negative    Past Medical History:  Diagnosis Date  . Diabetes mellitus     dx w/ an  A1c 6.0 2011  . Hyperlipidemia   . Hypertension     Past Surgical History:  Procedure Laterality Date  . ANTERIOR CERVICAL DECOMP/DISCECTOMY FUSION  09/19/2012   Procedure: ANTERIOR CERVICAL DECOMPRESSION/DISCECTOMY FUSION 2 LEVELS;  Surgeon: Erline Levine, MD;  Location: [place];  Service: Neurosurgery;  Laterality: N/A;  Cervical Four-Five Cervical Five-Six Anterior cervical decompression/diskectomy/fusion    Social History   Socioeconomic History  . Marital status: Married    Spouse name: Not on file  . Number of children: 0  . Years of education: Not on file  . Highest education level: Not on file  Occupational History  . 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 Financial resource strain: Not on file  . Food insecurity    Worry: Not on file    Inability: Not on file  . Transportation needs    Medical: Not on file    Non-medical: Not on file  Tobacco Use  . Smoking status: Former Research scientist (life sciences)  . Smokeless tobacco: Never Used  . Tobacco comment: quit in the 80s  Substance and Sexual Activity  . Alcohol use: Yes    Comment: rarely  . Drug use: No  . Sexual activity: Not on file  Lifestyle  . Physical activity    Days per week: Not on file    Minutes per session: Not on file  . Stress: Not on file  Relationships  . Social Herbalist on phone: Not on file    Gets together: Not on file    Attends religious service: Not on file    Active member of club or organization: Not on file    Attends meetings of clubs or organizations: Not on file    Relationship  status: Not on file  . Intimate partner violence    Fear of current or ex partner: Not on file    Emotionally abused: Not on file    Physically abused: Not on file    Forced sexual activity: Not on file  Other Topics Concern  . Not on file  Social History Narrative   Married, has no children of his own but he does have step kids   former Corporate treasurer, Engineer, manufacturing, was deployed in [place]           Family History  Problem Relation Age of Onset  . Prostate cancer Father 76       at age 59  . CAD Mother        Mother MI at age 40  . Colon cancer Neg Hx   . Diabetes Neg Hx   . Stroke Neg Hx      Allergies as of 08/03/2019      Reactions   Strawberry Extract Itching   Bee Venom    Penicillins       Medication List       Accurate as of August 03, 2019 11:59 PM. If you have any questions, ask your nurse or doctor.  aspirin 81 MG tablet Take 81 mg by mouth daily.   atenolol 25 MG tablet Commonly known as: TENORMIN Take 1 tablet (25 mg total) by mouth daily.   atenolol 50 MG tablet Commonly known as: TENORMIN Take 1 tablet (50 mg total) by mouth daily.   EPINEPHrine 0.3 mg/0.3 mL Soaj injection Commonly known as: EPI-PEN Inject 0.3 mLs (0.3 mg total) into the muscle as needed.   hydrochlorothiazide 25 MG tablet Commonly known as: HYDRODIURIL Take 1 tablet (25 mg total) by mouth daily.   losartan 100 MG tablet Commonly known as: COZAAR Take 1 tablet (100 mg total) by mouth daily.   simvastatin 40 MG tablet Commonly known as: ZOCOR Take 1 tablet (40 mg total) by mouth daily.           Objective:   Physical Exam BP 136/72 (BP Location: Left Arm, Patient Position: Sitting, Cuff Size: Normal)   Pulse 70   Temp (!) 96.9 F (36.1 C) (Temporal)   Resp 16   Ht 5\' 9"  (1.753 m)   Wt 214 lb (97.1 kg)   SpO2 99%   BMI 31.60 kg/m  General: Well developed, NAD, BMI noted Neck: No  thyromegaly  HEENT:  Normocephalic . Face symmetric, atraumatic Lungs:   CTA B Normal respiratory effort, no intercostal retractions, no accessory muscle use. Heart: RRR,  no murmur.  No pretibial edema bilaterally  Abdomen:  Not distended, soft, non-tender. No rebound or rigidity.   Skin: Exposed areas without rash. Not pale. Not jaundice Neurologic:  alert & oriented X3.  Speech normal, gait appropriate for age and unassisted Strength symmetric and appropriate for age.  Psych: Cognition and judgment appear intact.  Cooperative with normal attention span and concentration.  Behavior appropriate. No anxious or depressed appearing.     Assessment     Assessment   Hyperglycemia Paresthesias, lower extremity. Since neck surgery HTN Hyperlipidemia Bee sting allergies (epipen) HOH_ hearing aid at the [place]: Here for CPX Chronic medical problems managed with aspirin, Tenormin, HCTZ, losartan and simvastatin. Checking labs EKG today for hypertension: NSR, no change from previous. RTC 1 year, sooner if needed

## 2019-08-03 NOTE — Progress Notes (Signed)
Pre visit review using our clinic review tool, if applicable. No additional management support is needed unless otherwise documented below in the visit note. 

## 2019-08-03 NOTE — Assessment & Plan Note (Signed)
-   Td   2012 - pneumonia shot 2014 -  prevnar 2016 - zostavax/shingrix discussed , declined  - flu shot today - CCS,  (-) cologuard 04-2016, reports a   stools test @ the [place] ~ 9- 2019 (apparently a IFOB); seen at the [place] last month and they will provide a cologuard (per pt ) -Prostate cancer screening: DRE -PSA wnl 2019 - Diet and exercise discussed - Labs: CMP, FLP, CBC, A1c, TSH

## 2019-08-04 NOTE — Assessment & Plan Note (Signed)
Here for CPX Chronic medical problems managed with aspirin, Tenormin, HCTZ, losartan and simvastatin. Checking labs EKG today for hypertension: NSR, no change from previous. RTC 1 year, sooner if needed

## 2020-08-04 ENCOUNTER — Telehealth: Payer: Self-pay

## 2020-08-04 ENCOUNTER — Encounter: Payer: Self-pay | Admitting: Internal Medicine

## 2020-08-04 ENCOUNTER — Other Ambulatory Visit: Payer: Self-pay

## 2020-08-04 ENCOUNTER — Ambulatory Visit (INDEPENDENT_AMBULATORY_CARE_PROVIDER_SITE_OTHER): Payer: 59 | Admitting: Internal Medicine

## 2020-08-04 VITALS — BP 133/66 | HR 62 | Temp 97.9°F | Resp 18 | Ht 69.0 in | Wt 207.4 lb

## 2020-08-04 DIAGNOSIS — Z0000 Encounter for general adult medical examination without abnormal findings: Secondary | ICD-10-CM

## 2020-08-04 DIAGNOSIS — Z23 Encounter for immunization: Secondary | ICD-10-CM

## 2020-08-04 NOTE — Progress Notes (Signed)
Subjective:    Patient ID: Kyle Hatfield, male    DOB: [birth year], 74 y.o.   MRN: [id number]  DOS:  08/04/2020 Type of visit - description: CPX Since the last year he is doing well   Review of Systems    A 14 point review of systems is negative    Past Medical History:  Diagnosis Date  . Diabetes mellitus     dx w/ an  A1c 6.0 2011  . Hyperlipidemia   . Hypertension     Past Surgical History:  Procedure Laterality Date  . ANTERIOR CERVICAL DECOMP/DISCECTOMY FUSION  09/19/2012   Procedure: ANTERIOR CERVICAL DECOMPRESSION/DISCECTOMY FUSION 2 LEVELS;  Surgeon: Erline Levine, MD;  Location: [facility];  Service: Neurosurgery;  Laterality: N/A;  Cervical Four-Five Cervical Five-Six Anterior cervical decompression/diskectomy/fusion    Allergies as of 08/04/2020      Reactions   Strawberry Extract Itching   Bee Venom    Penicillins       Medication List       Accurate as of August 04, 2020 11:59 PM. If you have any questions, ask your nurse or doctor.        aspirin 81 MG tablet Take 81 mg by mouth daily.   atenolol 25 MG tablet Commonly known as: TENORMIN Take 1 tablet (25 mg total) by mouth daily.   atenolol 50 MG tablet Commonly known as: TENORMIN Take 1 tablet (50 mg total) by mouth daily.   EPINEPHrine 0.3 mg/0.3 mL Soaj injection Commonly known as: EPI-PEN Inject 0.3 mLs (0.3 mg total) into the muscle as needed.   hydrochlorothiazide 25 MG tablet Commonly known as: HYDRODIURIL Take 1 tablet (25 mg total) by mouth daily.   losartan 100 MG tablet Commonly known as: COZAAR Take 1 tablet (100 mg total) by mouth daily.   simvastatin 40 MG tablet Commonly known as: ZOCOR Take 1 tablet (40 mg total) by mouth daily.          Objective:   Physical Exam BP 133/66 (BP Location: Left Arm, Patient Position: Sitting, Cuff Size: Normal)   Pulse 62   Temp 97.9 F (36.6 C) (Oral)   Resp 18   Ht 5\' 9"  (1.753 m)   Wt 207 lb 6 oz (94.1 kg)   SpO2  99%   BMI 30.62 kg/m  General: Well developed, NAD, BMI noted Neck: No  thyromegaly  HEENT:  Normocephalic . Face symmetric, atraumatic Lungs:  CTA B Normal respiratory effort, no intercostal retractions, no accessory muscle use. Heart: RRR,  no murmur.  Abdomen:  Not distended, soft, non-tender. No rebound or rigidity.   Lower extremities: no pretibial edema bilaterally  Skin: Exposed areas without rash. Not pale. Not jaundice Neurologic:  alert & oriented X3.  Speech normal, gait appropriate for age and unassisted Strength symmetric and appropriate for age.  Psych: Cognition and judgment appear intact.  Cooperative with normal attention span and concentration.  Behavior appropriate. No anxious or depressed appearing.     Assessment      Assessment   Hyperglycemia Paresthesias, lower extremity. Since neck surgery HTN Hyperlipidemia Bee sting allergies (epipen) HOH_ hearing aid at the [facility]: Here for CPX Had a yearly checkup the [place], blood was drawn including a PSA per patient. Chronic medical problems seem stable, checking labs. RTC 1 year  This visit occurred during the SARS-CoV-2 public health emergency.  Safety protocols were in place, including screening questions prior to the visit, additional usage of staff PPE,  and extensive cleaning of exam room while observing appropriate contact time as indicated for disinfecting solutions.

## 2020-08-04 NOTE — Progress Notes (Signed)
Pre visit review using our clinic review tool, if applicable. No additional management support is needed unless otherwise documented below in the visit note. 

## 2020-08-04 NOTE — Telephone Encounter (Signed)
Cologuard ordered

## 2020-08-04 NOTE — Patient Instructions (Signed)
Check the  blood pressure monthly. BP GOAL is between 110/65 and  135/85. If it is consistently higher or lower, let me know     GO TO THE LAB : Get the blood work     GO TO THE FRONT DESK, PLEASE SCHEDULE YOUR APPOINTMENTS Come back for   physical exam in 1 year 

## 2020-08-05 ENCOUNTER — Encounter: Payer: Self-pay | Admitting: Internal Medicine

## 2020-08-05 LAB — CBC WITH DIFFERENTIAL/PLATELET
Absolute Monocytes: 762 cells/uL (ref 200–950)
Basophils Absolute: 32 cells/uL (ref 0–200)
Basophils Relative: 0.5 %
Eosinophils Absolute: 141 cells/uL (ref 15–500)
Eosinophils Relative: 2.2 %
HCT: 46.6 % (ref 38.5–50.0)
Hemoglobin: 15.7 g/dL (ref 13.2–17.1)
Lymphs Abs: 1350 cells/uL (ref 850–3900)
MCH: 29.4 pg (ref 27.0–33.0)
MCHC: 33.7 g/dL (ref 32.0–36.0)
MCV: 87.3 fL (ref 80.0–100.0)
MPV: 10.1 fL (ref 7.5–12.5)
Monocytes Relative: 11.9 %
Neutro Abs: 4115 cells/uL (ref 1500–7800)
Neutrophils Relative %: 64.3 %
Platelets: 190 10*3/uL (ref 140–400)
RBC: 5.34 10*6/uL (ref 4.20–5.80)
RDW: 12.2 % (ref 11.0–15.0)
Total Lymphocyte: 21.1 %
WBC: 6.4 10*3/uL (ref 3.8–10.8)

## 2020-08-05 LAB — COMPREHENSIVE METABOLIC PANEL
AG Ratio: 1.6 (calc) (ref 1.0–2.5)
ALT: 16 U/L (ref 9–46)
AST: 19 U/L (ref 10–35)
Albumin: 4.4 g/dL (ref 3.6–5.1)
Alkaline phosphatase (APISO): 57 U/L (ref 35–144)
BUN: 13 mg/dL (ref 7–25)
CO2: 31 mmol/L (ref 20–32)
Calcium: 9.4 mg/dL (ref 8.6–10.3)
Chloride: 99 mmol/L (ref 98–110)
Creat: 1.14 mg/dL (ref 0.70–1.18)
Globulin: 2.7 g/dL (calc) (ref 1.9–3.7)
Glucose, Bld: 108 mg/dL — ABNORMAL HIGH (ref 65–99)
Potassium: 4.4 mmol/L (ref 3.5–5.3)
Sodium: 139 mmol/L (ref 135–146)
Total Bilirubin: 0.7 mg/dL (ref 0.2–1.2)
Total Protein: 7.1 g/dL (ref 6.1–8.1)

## 2020-08-05 LAB — LIPID PANEL
Cholesterol: 124 mg/dL (ref <200)
HDL: 36 mg/dL — ABNORMAL LOW (ref >=40)
LDL Cholesterol (Calc): 61 mg/dL (calc)
Non-HDL Cholesterol (Calc): 88 mg/dL (calc) (ref <130)
Total CHOL/HDL Ratio: 3.4 (calc) (ref <5.0)
Triglycerides: 203 mg/dL — ABNORMAL HIGH (ref <150)

## 2020-08-05 LAB — HEMOGLOBIN A1C
Hgb A1c MFr Bld: 5.7 % of total Hgb — ABNORMAL HIGH (ref <5.7)
Mean Plasma Glucose: 117 (calc)
eAG (mmol/L): 6.5 (calc)

## 2020-08-05 NOTE — Assessment & Plan Note (Signed)
-   Td   2012 - PNM 23: 2014; PNM 13: 2016 - zostavax/shingrix discussed , declined today - had covid shots - flu shot today - CCS,  (-) cologuard 04-2016, requests a  Cologuard, will arrange -Prostate cancer screening: PSA check las month at the [place], no records  -Lifestyle: He remains active despite retirement, working on his yard - Labs: CMP, FLP, CBC, A1c

## 2020-08-05 NOTE — Assessment & Plan Note (Signed)
Here for CPX Had a yearly checkup the [place], blood was drawn including a PSA per patient. Chronic medical problems seem stable, checking labs. RTC 1 year

## 2020-08-07 ENCOUNTER — Encounter: Payer: Self-pay | Admitting: *Deleted

## 2020-08-25 LAB — COLOGUARD: Cologuard: POSITIVE — AB

## 2020-09-04 ENCOUNTER — Telehealth: Payer: Self-pay

## 2020-09-04 DIAGNOSIS — R195 Other fecal abnormalities: Secondary | ICD-10-CM

## 2020-09-04 NOTE — Telephone Encounter (Signed)
Cologuard result from 08/25/2020 positive.

## 2020-09-04 NOTE — Telephone Encounter (Signed)
GI referral placed to [facility]. Cologuard abstracted.

## 2020-09-04 NOTE — Telephone Encounter (Signed)
Left a detailed message: Cologuard returned +, standard of care is colonoscopy. Arrange a GI referral. Patient advised to call me if he has questions of concerns or if he is not contacted by GI

## 2020-09-05 NOTE — Telephone Encounter (Signed)
Pt returning your call.  Telephone: [phone]

## 2020-09-05 NOTE — Telephone Encounter (Signed)
Spoke with patient, advised that Cologuard is a screening test and we need to follow protocol and send him to GI.  He is in agreement.

## 2020-09-26 ENCOUNTER — Encounter: Payer: Self-pay | Admitting: Gastroenterology

## 2020-10-27 ENCOUNTER — Other Ambulatory Visit: Payer: Self-pay

## 2020-10-27 ENCOUNTER — Ambulatory Visit (AMBULATORY_SURGERY_CENTER): Payer: Self-pay | Admitting: *Deleted

## 2020-10-27 VITALS — Ht 69.0 in | Wt 203.0 lb

## 2020-10-27 DIAGNOSIS — R195 Other fecal abnormalities: Secondary | ICD-10-CM

## 2020-10-27 MED ORDER — SUPREP BOWEL PREP KIT 17.5-3.13-1.6 GM/177ML PO SOLN: 1.0000 | Freq: Once | ORAL | 0 refills | Status: DC

## 2020-10-27 MED ORDER — SUPREP BOWEL PREP KIT 17.5-3.13-1.6 GM/177ML PO SOLN: 1.0000 | Freq: Once | ORAL | 0 refills | Status: AC

## 2020-10-27 NOTE — Progress Notes (Signed)
fuuly vax'd  No egg or soy allergy known to patient  No issues with past sedation with any surgeries or procedures no intubation problems in the past  No FH of Malignant Hyperthermia No diet pills per patient No home 02 use per patient  No blood thinners per patient  Pt denies issues with constipation  No A fib or A flutter  EMMI video to pt or via [place] 19 guidelines implemented in PV today with Pt and RN    Due to the COVID-19 pandemic we are asking patients to follow these guidelines. Please only bring one care partner. Please be aware that your care partner may wait in the car in the parking lot or if they feel like they will be too hot to wait in the car, they may wait in the lobby on the 4th floor. All care partners are required to wear a mask the entire time (we do not have any that we can provide them), they need to practice social distancing, and we will do a Covid check for all patient's and care partners when you arrive. Also we will check their temperature and your temperature. If the care partner waits in their car they need to stay in the parking lot the entire time and we will call them on their cell phone when the patient is ready for discharge so they can bring the car to the front of the building. Also all patient's will need to wear a mask into building.

## 2020-11-27 ENCOUNTER — Ambulatory Visit (AMBULATORY_SURGERY_CENTER): Payer: 59 | Admitting: Gastroenterology

## 2020-11-27 ENCOUNTER — Encounter: Payer: Self-pay | Admitting: Gastroenterology

## 2020-11-27 ENCOUNTER — Other Ambulatory Visit: Payer: Self-pay

## 2020-11-27 VITALS — BP 103/58 | HR 67 | Temp 97.9°F | Resp 19 | Ht 69.0 in | Wt 203.0 lb

## 2020-11-27 DIAGNOSIS — R195 Other fecal abnormalities: Secondary | ICD-10-CM

## 2020-11-27 DIAGNOSIS — K635 Polyp of colon: Secondary | ICD-10-CM

## 2020-11-27 DIAGNOSIS — D124 Benign neoplasm of descending colon: Secondary | ICD-10-CM

## 2020-11-27 DIAGNOSIS — D122 Benign neoplasm of ascending colon: Secondary | ICD-10-CM

## 2020-11-27 MED ORDER — SODIUM CHLORIDE 0.9 % IV SOLN
500.0000 mL | Freq: Once | INTRAVENOUS | Status: DC
Start: 2020-11-27 — End: 2020-11-27

## 2020-11-27 NOTE — Op Note (Signed)
[place] Patient Name: Kyle Hatfield Procedure Date: 11/27/2020 8:21 AM MRN: [id number] Endoscopist: Mallie Mussel L. Loletha Carrow , MD Age: 75 Referring MD:  Date of Birth: 03-Feb-1946 Gender: Male Account #: [id number] Procedure:                Colonoscopy Indications:              Positive Cologuard test Medicines:                Monitored Anesthesia Care Procedure:                Pre-Anesthesia Assessment:                           - Prior to the procedure, a History and Physical                            was performed, and patient medications and                            allergies were reviewed. The patient's tolerance of                            previous anesthesia was also reviewed. The risks                            and benefits of the procedure and the sedation                            options and risks were discussed with the patient.                            All questions were answered, and informed consent                            was obtained. Prior Anticoagulants: The patient has                            taken no previous anticoagulant or antiplatelet                            agents. ASA Grade Assessment: II - A patient with                            mild systemic disease. After reviewing the risks                            and benefits, the patient was deemed in                            satisfactory condition to undergo the procedure.                           After obtaining informed consent, the colonoscope  was passed under direct vision. Throughout the                            procedure, the patient's blood pressure, pulse, and                            oxygen saturations were monitored continuously. The                            Olympus CF-HQ190 [id number]) Colonoscope was                            introduced through the anus and advanced to the the                            cecum, identified by appendiceal orifice  and                            ileocecal valve. The colonoscopy was performed                            without difficulty. The patient tolerated the                            procedure well. The quality of the bowel                            preparation was good. The ileocecal valve,                            appendiceal orifice, and rectum were photographed. Scope In: 8:38:45 AM Scope Out: 9:05:50 AM Scope Withdrawal Time: 0 hours 23 minutes 4 seconds  Total Procedure Duration: 0 hours 27 minutes 5 seconds  Findings:                 The perianal and digital rectal examinations were                            normal.                           Retroflexion in the right colon was performed.                           Three flat polyps were found in the ascending                            colon. The polyps were diminutive in size. These                            polyps were removed with a cold snare. Resection                            and retrieval were complete.  A 5 mm polyp was found in the descending colon. The                            polyp was flat. The polyp was removed with a cold                            snare. Resection and retrieval were complete.                           The exam was otherwise without abnormality on                            direct and retroflexion views. Complications:            No immediate complications. Estimated Blood Loss:     Estimated blood loss was minimal. Impression:               - Three diminutive polyps in the ascending colon,                            removed with a cold snare. Resected and retrieved.                           - One 5 mm polyp in the descending colon, removed                            with a cold snare. Resected and retrieved.                           - The examination was otherwise normal on direct                            and retroflexion views. Recommendation:           - Patient  has a contact number available for                            emergencies. The signs and symptoms of potential                            delayed complications were discussed with the                            patient. Return to normal activities tomorrow.                            Written discharge instructions were provided to the                            patient.                           - Resume previous diet.                           -  Continue present medications.                           - Await pathology results.                           - Repeat colonoscopy is recommended for                            surveillance. The colonoscopy date will be                            determined after pathology results from today's                            exam become available for review. Delores Edelstein L. Loletha Carrow, MD 11/27/2020 9:09:47 AM This report has been signed electronically.

## 2020-11-27 NOTE — Progress Notes (Signed)
Called to room to assist during endoscopic procedure.  Patient ID and intended procedure confirmed with present staff. Received instructions for my participation in the procedure from the performing physician.  

## 2020-11-27 NOTE — Progress Notes (Signed)
Report given to PACU, vss 

## 2020-11-27 NOTE — Patient Instructions (Signed)
Impression/Recommendations:  Polyp handout given to patient.  Resume previous diet. Continue present medications. Await pathology results.  Repeat colonoscopy recommended for surveillance.  Date to be determined after pathology results reviewed.  YOU HAD AN ENDOSCOPIC PROCEDURE TODAY AT THE [facility]:   Refer to the procedure report that was given to you for any specific questions about what was found during the examination.  If the procedure report does not answer your questions, please call your gastroenterologist to clarify.  If you requested that your care partner not be given the details of your procedure findings, then the procedure report has been included in a sealed envelope for you to review at your convenience later.  YOU SHOULD EXPECT: Some feelings of bloating in the abdomen. Passage of more gas than usual.  Walking can help get rid of the air that was put into your GI tract during the procedure and reduce the bloating. If you had a lower endoscopy (such as a colonoscopy or flexible sigmoidoscopy) you may notice spotting of blood in your stool or on the toilet paper. If you underwent a bowel prep for your procedure, you may not have a normal bowel movement for a few days.  Please Note:  You might notice some irritation and congestion in your nose or some drainage.  This is from the oxygen used during your procedure.  There is no need for concern and it should clear up in a day or so.  SYMPTOMS TO REPORT IMMEDIATELY:  Following lower endoscopy (colonoscopy or flexible sigmoidoscopy):  Excessive amounts of blood in the stool  Significant tenderness or worsening of abdominal pains  Swelling of the abdomen that is new, acute  Fever of 100F or higher For urgent or emergent issues, a gastroenterologist can be reached at any hour by calling [phone]. Do not use MyChart messaging for urgent concerns.    DIET:  We do recommend a small meal at first, but then you  may proceed to your regular diet.  Drink plenty of fluids but you should avoid alcoholic beverages for 24 hours.  ACTIVITY:  You should plan to take it easy for the rest of today and you should NOT DRIVE or use heavy machinery until tomorrow (because of the sedation medicines used during the test).    FOLLOW UP: Our staff will call the number listed on your records 48-72 hours following your procedure to check on you and address any questions or concerns that you may have regarding the information given to you following your procedure. If we do not reach you, we will leave a message.  We will attempt to reach you two times.  During this call, we will ask if you have developed any symptoms of COVID 19. If you develop any symptoms (ie: fever, flu-like symptoms, shortness of breath, cough etc.) before then, please call [phone].  If you test positive for Covid 19 in the 2 weeks post procedure, please call and report this information to us.    If any biopsies were taken you will be contacted by phone or by letter within the next 1-3 weeks.  Please call us at [phone] if you have not heard about the biopsies in 3 weeks.    SIGNATURES/CONFIDENTIALITY: You and/or your care partner have signed paperwork which will be entered into your electronic medical record.  These signatures attest to the fact that that the information above on your After Visit Summary has been reviewed and is understood.  Full responsibility of   of the confidentiality of this discharge information lies with you and/or your care-partner.

## 2020-11-27 NOTE — Progress Notes (Signed)
Medical history reviewed with no changes noted. VS assessed by C.W 

## 2020-12-02 ENCOUNTER — Telehealth: Payer: Self-pay | Admitting: *Deleted

## 2020-12-02 NOTE — Telephone Encounter (Signed)
  Follow up Call-  Call back number 11/27/2020  Post procedure Call Back phone  # [phone]  Permission to leave phone message Yes  Some recent data might be hidden     Patient questions:  Do you have a fever, pain , or abdominal swelling? No. Pain Score  0 *  Have you tolerated food without any problems? Yes.    Have you been able to return to your normal activities? Yes.    Do you have any questions about your discharge instructions: Diet   No. Medications  No. Follow up visit  No.  Do you have questions or concerns about your Care? No.  Actions: * If pain score is 4 or above: No action needed, pain <4.  1. Have you developed a fever since your procedure? no  2.   Have you had an respiratory symptoms (SOB or cough) since your procedure? no  3.   Have you tested positive for COVID 19 since your procedure no  4.   Have you had any family members/close contacts diagnosed with the COVID 19 since your procedure?  no   If yes to any of these questions please route to Joylene John, RN and Joella Prince, RN

## 2020-12-11 ENCOUNTER — Encounter: Payer: Self-pay | Admitting: Gastroenterology

## 2021-03-11 ENCOUNTER — Encounter: Payer: Self-pay | Admitting: Internal Medicine

## 2021-08-05 ENCOUNTER — Encounter: Payer: 59 | Admitting: Internal Medicine

## 2021-10-06 ENCOUNTER — Encounter: Payer: 59 | Admitting: Internal Medicine

## 2022-01-05 ENCOUNTER — Encounter: Payer: Self-pay | Admitting: Internal Medicine

## 2022-01-05 DIAGNOSIS — Z77098 Contact with and (suspected) exposure to other hazardous, chiefly nonmedicinal, chemicals: Secondary | ICD-10-CM | POA: Insufficient documentation

## 2023-02-25 ENCOUNTER — Encounter: Payer: Self-pay | Admitting: Internal Medicine
